# Patient Record
Sex: Female | Born: 1985 | Race: White | Hispanic: No | Smoking: Never smoker
Health system: Southern US, Community
[De-identification: ages and names within clinical notes are randomized; demographics above are authoritative.]

## PROBLEM LIST (undated history)

## (undated) DIAGNOSIS — R519 Headache, unspecified: Secondary | ICD-10-CM

## (undated) DIAGNOSIS — F32A Depression, unspecified: Secondary | ICD-10-CM

## (undated) DIAGNOSIS — F419 Anxiety disorder, unspecified: Secondary | ICD-10-CM

## (undated) DIAGNOSIS — I10 Essential (primary) hypertension: Secondary | ICD-10-CM

## (undated) DIAGNOSIS — H9190 Unspecified hearing loss, unspecified ear: Secondary | ICD-10-CM

## (undated) DIAGNOSIS — F329 Major depressive disorder, single episode, unspecified: Secondary | ICD-10-CM

## (undated) DIAGNOSIS — R51 Headache: Secondary | ICD-10-CM

## (undated) HISTORY — DX: Essential (primary) hypertension: I10

## (undated) HISTORY — PX: TONSILLECTOMY AND ADENOIDECTOMY: SUR1326

## (undated) HISTORY — DX: Major depressive disorder, single episode, unspecified: F32.9

## (undated) HISTORY — DX: Depression, unspecified: F32.A

## (undated) HISTORY — DX: Headache: R51

## (undated) HISTORY — DX: Headache, unspecified: R51.9

---

## 2011-12-16 ENCOUNTER — Encounter (HOSPITAL_COMMUNITY): Payer: Self-pay | Admitting: Emergency Medicine

## 2011-12-16 ENCOUNTER — Emergency Department (HOSPITAL_COMMUNITY)
Admission: EM | Admit: 2011-12-16 | Discharge: 2011-12-16 | Disposition: A | Discharge status: Other Acute Inpatient Hospital | Payer: BC Managed Care – PPO | Attending: Emergency Medicine | Admitting: Emergency Medicine

## 2011-12-16 ENCOUNTER — Inpatient Hospital Stay (HOSPITAL_COMMUNITY)
Admission: AD | Admit: 2011-12-16 | Discharge: 2011-12-17 | DRG: 750 | Disposition: A | Discharge status: Home / Self Care | Payer: BC Managed Care – PPO | Source: Ambulatory Visit | Attending: Psychiatry | Admitting: Psychiatry

## 2011-12-16 DIAGNOSIS — E669 Obesity, unspecified: Secondary | ICD-10-CM | POA: Insufficient documentation

## 2011-12-16 DIAGNOSIS — F1010 Alcohol abuse, uncomplicated: Secondary | ICD-10-CM

## 2011-12-16 DIAGNOSIS — F10929 Alcohol use, unspecified with intoxication, unspecified: Secondary | ICD-10-CM

## 2011-12-16 DIAGNOSIS — H9190 Unspecified hearing loss, unspecified ear: Secondary | ICD-10-CM

## 2011-12-16 DIAGNOSIS — R45851 Suicidal ideations: Secondary | ICD-10-CM

## 2011-12-16 DIAGNOSIS — F411 Generalized anxiety disorder: Secondary | ICD-10-CM

## 2011-12-16 DIAGNOSIS — F332 Major depressive disorder, recurrent severe without psychotic features: Secondary | ICD-10-CM

## 2011-12-16 LAB — COMPREHENSIVE METABOLIC PANEL
Albumin: 4.1 g/dL (ref 3.5–5.2)
Alkaline Phosphatase: 84 U/L (ref 39–117)
BUN: 11 mg/dL (ref 6–23)
Chloride: 104 mEq/L (ref 96–112)
Creatinine, Ser: 0.67 mg/dL (ref 0.50–1.10)
GFR calc Af Amer: >90 mL/min (ref >90)
Glucose, Bld: 105 mg/dL — ABNORMAL HIGH (ref 70–99)
Potassium: 3.5 mEq/L (ref 3.5–5.1)
Total Bilirubin: 0.2 mg/dL — ABNORMAL LOW (ref 0.3–1.2)
Total Protein: 7.5 g/dL (ref 6.0–8.3)

## 2011-12-16 LAB — CBC
HCT: 40 % (ref 36.0–46.0)
Hemoglobin: 14 g/dL (ref 12.0–15.0)
MCH: 31.7 pg (ref 26.0–34.0)
MCHC: 35 g/dL (ref 30.0–36.0)
MCV: 90.5 fL (ref 78.0–100.0)
Platelets: 238 K/uL (ref 150–400)
RBC: 4.42 MIL/uL (ref 3.87–5.11)
RDW: 12.6 % (ref 11.5–15.5)
WBC: 9.1 K/uL (ref 4.0–10.5)

## 2011-12-16 LAB — ETHANOL: Alcohol, Ethyl (B): 328 mg/dL — ABNORMAL HIGH (ref 0–11)

## 2011-12-16 LAB — RAPID URINE DRUG SCREEN, HOSP PERFORMED: Opiates: NOT DETECTED

## 2011-12-16 MED ORDER — ONDANSETRON 4 MG PO TBDP: 4.0000 mg | ORAL_TABLET | Freq: Four times a day (QID) | ORAL | Status: DC | PRN

## 2011-12-16 MED ORDER — CHLORDIAZEPOXIDE HCL 25 MG PO CAPS
50.0000 mg | ORAL_CAPSULE | Freq: Once | ORAL | Status: AC
  Administered 2011-12-16: 50 mg via ORAL
  Filled 2011-12-16: qty 2

## 2011-12-16 MED ORDER — LORAZEPAM 2 MG/ML IJ SOLN: 1.0000 mg | Freq: Four times a day (QID) | INTRAMUSCULAR | Status: DC | PRN

## 2011-12-16 MED ORDER — FOLIC ACID 1 MG PO TABS
1.0000 mg | ORAL_TABLET | Freq: Every day | ORAL | Status: DC
  Administered 2011-12-16: 1 mg via ORAL
  Filled 2011-12-16: qty 1

## 2011-12-16 MED ORDER — VITAMIN B-1 100 MG PO TABS: 100.0000 mg | ORAL_TABLET | Freq: Every day | ORAL | Status: DC

## 2011-12-16 MED ORDER — ONDANSETRON HCL 4 MG PO TABS: 4.0000 mg | ORAL_TABLET | Freq: Three times a day (TID) | ORAL | Status: DC | PRN

## 2011-12-16 MED ORDER — LORAZEPAM 1 MG PO TABS: 1.0000 mg | ORAL_TABLET | Freq: Four times a day (QID) | ORAL | Status: DC | PRN

## 2011-12-16 MED ORDER — HYDROXYZINE HCL 25 MG PO TABS: 25.0000 mg | ORAL_TABLET | Freq: Four times a day (QID) | ORAL | Status: DC | PRN

## 2011-12-16 MED ORDER — CHLORDIAZEPOXIDE HCL 25 MG PO CAPS: 25.0000 mg | ORAL_CAPSULE | Freq: Three times a day (TID) | ORAL | Status: DC

## 2011-12-16 MED ORDER — ACETAMINOPHEN 325 MG PO TABS
650.0000 mg | ORAL_TABLET | Freq: Four times a day (QID) | ORAL | Status: DC | PRN
  Administered 2011-12-16: 650 mg via ORAL
  Filled 2011-12-16: qty 2

## 2011-12-16 MED ORDER — THIAMINE HCL 100 MG/ML IJ SOLN
100.0000 mg | Freq: Every day | INTRAMUSCULAR | Status: DC
  Filled 2011-12-16: qty 2

## 2011-12-16 MED ORDER — LORAZEPAM 1 MG PO TABS: 0.0000 mg | ORAL_TABLET | Freq: Four times a day (QID) | ORAL | Status: DC

## 2011-12-16 MED ORDER — CHLORDIAZEPOXIDE HCL 25 MG PO CAPS: 25.0000 mg | ORAL_CAPSULE | ORAL | Status: DC

## 2011-12-16 MED ORDER — ADULT MULTIVITAMIN W/MINERALS CH
1.0000 | ORAL_TABLET | Freq: Every day | ORAL | Status: DC
  Administered 2011-12-16: 1 via ORAL
  Filled 2011-12-16: qty 1

## 2011-12-16 MED ORDER — MAGNESIUM HYDROXIDE 400 MG/5ML PO SUSP: 30.0000 mL | Freq: Every day | ORAL | Status: DC | PRN

## 2011-12-16 MED ORDER — CHLORDIAZEPOXIDE HCL 25 MG PO CAPS: 25.0000 mg | ORAL_CAPSULE | Freq: Four times a day (QID) | ORAL | Status: DC

## 2011-12-16 MED ORDER — ALUM & MAG HYDROXIDE-SIMETH 200-200-20 MG/5ML PO SUSP: 30.0000 mL | ORAL | Status: DC | PRN

## 2011-12-16 MED ORDER — NICOTINE 21 MG/24HR TD PT24
21.0000 mg | MEDICATED_PATCH | Freq: Every day | TRANSDERMAL | Status: DC
  Filled 2011-12-16: qty 1

## 2011-12-16 MED ORDER — CHLORDIAZEPOXIDE HCL 25 MG PO CAPS: 25.0000 mg | ORAL_CAPSULE | Freq: Every day | ORAL | Status: DC

## 2011-12-16 MED ORDER — CHLORDIAZEPOXIDE HCL 25 MG PO CAPS: 25.0000 mg | ORAL_CAPSULE | Freq: Four times a day (QID) | ORAL | Status: DC | PRN

## 2011-12-16 MED ORDER — ADULT MULTIVITAMIN W/MINERALS CH
1.0000 | ORAL_TABLET | Freq: Every day | ORAL | Status: DC
  Administered 2011-12-16: 2 via ORAL
  Filled 2011-12-16: qty 1

## 2011-12-16 MED ORDER — LORAZEPAM 1 MG PO TABS: 0.0000 mg | ORAL_TABLET | Freq: Two times a day (BID) | ORAL | Status: DC

## 2011-12-16 MED ORDER — LOPERAMIDE HCL 2 MG PO CAPS: 2.0000 mg | ORAL_CAPSULE | ORAL | Status: DC | PRN

## 2011-12-16 MED ORDER — THIAMINE HCL 100 MG/ML IJ SOLN: 100.0000 mg | Freq: Once | INTRAMUSCULAR | Status: DC

## 2011-12-16 MED ORDER — ZOLPIDEM TARTRATE 5 MG PO TABS: 5.0000 mg | ORAL_TABLET | Freq: Every evening | ORAL | Status: DC | PRN

## 2011-12-16 MED ORDER — TRAZODONE HCL 50 MG PO TABS
50.0000 mg | ORAL_TABLET | Freq: Every evening | ORAL | Status: DC | PRN
  Administered 2011-12-16: 50 mg via ORAL
  Filled 2011-12-16 (×5): qty 1

## 2011-12-16 MED ORDER — IBUPROFEN 200 MG PO TABS
400.0000 mg | ORAL_TABLET | Freq: Four times a day (QID) | ORAL | Status: DC | PRN
  Administered 2011-12-16: 400 mg via ORAL
  Filled 2011-12-16: qty 2

## 2011-12-16 MED ORDER — IBUPROFEN 600 MG PO TABS: 600.0000 mg | ORAL_TABLET | Freq: Three times a day (TID) | ORAL | Status: DC | PRN

## 2011-12-16 NOTE — ED Notes (Signed)
Report to Natasha RN

## 2011-12-16 NOTE — ED Provider Notes (Signed)
Pt resting comfortably. VS stable. Awaiting placement at [facility].  Loren Racer, MD 12/16/11 0730

## 2011-12-16 NOTE — ED Notes (Signed)
Pt alert, present to ED, +ETOH,  C/o SI. Denies HI, resp even unlabored, skin pwd
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
## 2011-12-16 NOTE — Progress Notes (Signed)
12/16/2011. 13:45. Voluntary admission, presents alone. Tearful. Last night she realized that she is drinking too much because alcohol is not solving her problems and she needs to talk with someone. Denies SI, but gets bad anxiety and stressed out. 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 Mother and friends are support group.

## 2011-12-16 NOTE — ED Notes (Signed)
Pt reports coming in for help with detox from ETOH, reports drinking everyday and, drank bottle of wine tonight after running into a female from her past. Pt feels like she is a disappointment to her mother who is a travel Engineer, civil (consulting). Pt reports having had 2 abortions in the past which mad her mom upset and that is the reason her mom left to travel out of state. Pt is student and reports she is going to graduate in May. Pt reports she thinks about killing herself everyday and has attempted in the past by cutting her wrist but she never told anybody about it. Pt reports struggling with depression and now wants some help before it kills her.

## 2011-12-16 NOTE — Progress Notes (Signed)
12/16/2011. 14:00. Pt signed a 72-hour request for release and verbalized understanding of same. 15:00. D: Friends of hers came and called from the lobby stating that they were here to pick her up, that she had called them.  They also indicted that she had told them that another friend of theirs is here as a patient currently.  Pt asked that we give them the code number so that they can visit. She also understands, per previous discussion, that she is not free to leave. She said that her friends already know that the other patient is here because one of them is his room-mate and he had actually brought that pt here.  A: Advised pt not to tell anyone if there is someone here that they know. R: Pt stated that she understands and will not do so.

## 2011-12-16 NOTE — ED Notes (Signed)
Security bedside to search pt and belongings

## 2011-12-16 NOTE — Discharge Planning (Signed)
Patient has been accepted by [facility] today. EDP has been notified. Patient is aware and agreeable to voluntary admission to [facility]. Will notify nursing of above.Sponsorship requested with [facility].   Bradly Bienenstock, SW Intern 12/16/2011 10:28 AM

## 2011-12-16 NOTE — ED Notes (Signed)
Requested pharmacy to change librium 25 mg to 1100.

## 2011-12-16 NOTE — ED Provider Notes (Signed)
History     CSN: [id number]  Arrival date & time 12/16/11  0043   First MD Initiated Contact with Patient 12/16/11 0057      Chief Complaint  Patient presents with  . Psychiatric Evaluation     HPI  History provided by the patient. Patient is a 26 year old female with no significant past medical history who presents with complaints of suicidal ideation and requests for help with alcohol detox. Patient states that she feels like she just wants to die and has thoughts of killing herself. Patient reports history of cutting her left wrist in the past over one year ago. She denies having any prior psychiatric diagnosis or evaluations. Patient denies any current medications. Patient also reports history of heavy alcohol use for the past year or longer. Patient reports drinking alcohol daily. Her last drink was several hours ago last evening. Patient denies ever having any serious withdrawal symptoms or complications. Patient denies having any other significant past medical history. Patient has no other complaints.    History reviewed. No pertinent past medical history.  Past Surgical History  Procedure Date  . Tonsillectomy and adenoidectomy     No family history on file.  History  Substance Use Topics  . Smoking status: Never Smoker   . Smokeless tobacco: Not on file  . Alcohol Use: Yes    OB History    Grav Para Term Preterm Abortions TAB SAB Ect Mult Living                  Review of Systems  Respiratory: Negative for shortness of breath.   Gastrointestinal: Negative for nausea, vomiting and abdominal pain.  Psychiatric/Behavioral: Positive for suicidal ideas.  All other systems reviewed and are negative.    Allergies  Review of patient's allergies indicates no known allergies.  Home Medications  No current outpatient prescriptions on file.  BP 155/89  Pulse 98  Temp 98 F (36.7 C)  Resp 16  Wt 240 lb (108.863 kg)  SpO2 99%  LMP 11/23/2011  Physical Exam    Nursing note and vitals reviewed. Constitutional: She is oriented to person, place, and time. She appears well-developed and well-nourished. No distress.  HENT:  Head: Normocephalic and atraumatic.  Cardiovascular: Normal rate and regular rhythm.   Pulmonary/Chest: Effort normal and breath sounds normal. No respiratory distress. She has no wheezes.  Abdominal:       Obese  Neurological: She is alert and oriented to person, place, and time.  Skin: Skin is warm and dry. No rash noted.  Psychiatric: Her behavior is normal. She exhibits a depressed mood. She expresses suicidal ideation.    ED Course  Procedures    Labs Reviewed  CBC  COMPREHENSIVE METABOLIC PANEL  ETHANOL  URINE RAPID DRUG SCREEN (HOSP PERFORMED)   Results for orders placed during the hospital encounter of 12/16/11  CBC      Component Value Range   WBC 9.1  4.0 - 10.5 (K/uL)   RBC 4.42  3.87 - 5.11 (MIL/uL)   Hemoglobin 14.0  12.0 - 15.0 (g/dL)   HCT 40.9  81.1 - 91.4 (%)   MCV 90.5  78.0 - 100.0 (fL)   MCH 31.7  26.0 - 34.0 (pg)   MCHC 35.0  30.0 - 36.0 (g/dL)   RDW 78.2  95.6 - 21.3 (%)   Platelets 238  150 - 400 (K/uL)  COMPREHENSIVE METABOLIC PANEL      Component Value Range   Sodium 140  135 -  145 (mEq/L)   Potassium 3.5  3.5 - 5.1 (mEq/L)   Chloride 104  96 - 112 (mEq/L)   CO2 26  19 - 32 (mEq/L)   Glucose, Bld 105 (*) 70 - 99 (mg/dL)   BUN 11  6 - 23 (mg/dL)   Creatinine, Ser 6.57  0.50 - 1.10 (mg/dL)   Calcium 9.1  8.4 - 84.6 (mg/dL)   Total Protein 7.5  6.0 - 8.3 (g/dL)   Albumin 4.1  3.5 - 5.2 (g/dL)   AST 17  0 - 37 (U/L)   ALT 18  0 - 35 (U/L)   Alkaline Phosphatase 84  39 - 117 (U/L)   Total Bilirubin 0.2 (*) 0.3 - 1.2 (mg/dL)   GFR calc non Af Amer >90  >90 (mL/min)   GFR calc Af Amer >90  >90 (mL/min)  ETHANOL      Component Value Range   Alcohol, Ethyl (B) 328 (*) 0 - 11 (mg/dL)  URINE RAPID DRUG SCREEN (HOSP PERFORMED)      Component Value Range   Opiates NONE DETECTED  NONE  DETECTED    Cocaine NONE DETECTED  NONE DETECTED    Benzodiazepines NONE DETECTED  NONE DETECTED    Amphetamines NONE DETECTED  NONE DETECTED    Tetrahydrocannabinol NONE DETECTED  NONE DETECTED    Barbiturates NONE DETECTED  NONE DETECTED        1. Alcohol abuse   2. Alcohol intoxication   3. Suicidal ideation       MDM  1:15AM patient seen and evaluated. Patient in no acute distress.   Patient moved to psych ed. I spoke with [facility] PAC team they will see patient and evaluate.  Psych holding orders and alcohol withdrawal protocols in place.     Angus Seller, [place] 12/16/11 [phone]

## 2011-12-16 NOTE — Discharge Planning (Signed)
This assessment/evaluation/note was completed by a Child psychotherapist of Social Work Warden/ranger and as Investment banker, corporate, I was immediately available for consultation/collaboration. I am in agreement with the contents and disposition reflected in the assessment/evaluation/note(s).  Ileene Hutchinson , MSW, LCSWA 12/16/2011 10:30 AM [phone]

## 2011-12-16 NOTE — ED Provider Notes (Signed)
Medical screening examination/treatment/procedure(s) were performed by non-physician practitioner and as supervising physician I was immediately available for consultation/collaboration.   Kellene Mccleary L Leondre Taul, MD 12/16/11 0559 

## 2011-12-16 NOTE — ED Notes (Signed)
Report given to Harriett Sine, RN, she stated "they were supposed to call you & tell you not to send the pt until 1230"; Diane, Charge, RN aware.

## 2011-12-17 DIAGNOSIS — F411 Generalized anxiety disorder: Secondary | ICD-10-CM

## 2011-12-17 LAB — PREGNANCY, URINE: Preg Test, Ur: NEGATIVE

## 2011-12-17 MED ORDER — ONDANSETRON 4 MG PO TBDP
4.0000 mg | ORAL_TABLET | Freq: Once | ORAL | Status: AC
  Administered 2011-12-17: 4 mg via ORAL

## 2011-12-17 MED ORDER — CHLORPROMAZINE HCL 25 MG PO TABS
25.0000 mg | ORAL_TABLET | Freq: Three times a day (TID) | ORAL | Status: DC
Start: 2011-12-17 — End: 2011-12-17

## 2011-12-17 MED ORDER — CHLORPROMAZINE HCL 25 MG PO TABS: 25.0000 mg | ORAL_TABLET | Freq: Three times a day (TID) | ORAL | Status: AC

## 2011-12-17 NOTE — Progress Notes (Signed)
Pt attended morning group. States that she decided to check into the hospital due to anxiety and depression. Feels that it contributes to her alcohol abuse. Pt states that she feels out of her element in the hospital and believes that if she could manage her anxiety and depression she would no longer abuse alcohol. Reports that she does not drink on a daily basis and amount of alcohol consumed depends on whether or not she has to go to class or work the next day. Pt would like to seek counseling outside of the hospital to help her manage her issues with stress and anxiety. 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

## 2011-12-17 NOTE — Progress Notes (Signed)
[facility] Adult Inpatient Family/Significant Other Suicide Prevention Education  Suicide Prevention Education:  Education Completed; Gae Bon, pt's Mother at [phone] has been identified by the patient as the family member/significant other with whom the patient will be residing, and identified as the person(s) who will aid the patient in the event of a mental health crisis (suicidal ideations/suicide attempt).  With written consent from the patient, the family member/significant other has been provided the following suicide prevention education, prior to the discharge of the patient.  The suicide prevention education provided includes the following:  Suicide risk factors  Suicide prevention and interventions  National Suicide Hotline telephone number  [facility] assessment telephone number  [facility] Emergency Assistance 911  [facility] and/or Residential Mobile Crisis Unit telephone number  Request made of family/significant other to:  Remove weapons (e.g., guns, rifles, knives), all items previously/currently identified as safety concern.    Remove drugs/medications (over-the-counter, prescriptions, illicit drugs), all items previously/currently identified as a safety concern.  Pt's mother states there are no firearms in the patient's home, nor narcotics and the alcohol has been disposed of.   The family member/significant other verbalizes understanding of the suicide prevention education information provided.  The family member/significant other agrees to remove the items of safety concern listed above.  Writer spoke with pt's mother in the lobby of [facility].  Clide Dales 12/17/2011, 4:38 PM
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
## 2011-12-17 NOTE — H&P (Signed)
Psychiatric Admission Assessment Adult  Patient Identification:  Julie Kennedy Date of Evaluation:  12/17/2011 Chief Complaint:  ETOH ABUSE  MDD REC SEV  ANXIETY DISORDER NOS History of Present Illness:: Pt. In through the ED due to increased alcohol intake and statements regarding not wanting to live. Mood Symptoms:  None Depression Symptoms: none  (Hypo) Manic Symptoms: none Anxiety Symptoms: increased alcohol intake Psychotic Symptoms: none PTSD Symptoms:  none Abuse/Neglect/Assault/trauma: denies   Primary Care Provider:  none  Past Medical History:  History reviewed. No pertinent past medical history. Traumatic Brain Injury: none History of Loss of Consciousness: none  Surgical History:  none Seizure History:   none Cardiac History:    none  Allergies:  No Known Allergies Current Medications:  Current Facility-Administered Medications  Medication Dose Route Frequency Provider Last Rate Last Dose  . acetaminophen (TYLENOL) tablet 650 mg  650 mg Oral Q6H PRN Verne Spurr, PA   650 mg at 12/16/11 1810  . alum & mag hydroxide-simeth (MAALOX/MYLANTA) 200-200-20 MG/5ML suspension 30 mL  30 mL Oral Q4H PRN Verne Spurr, PA      . chlorproMAZINE (THORAZINE) tablet 25 mg  25 mg Oral TID Verne Spurr, PA      . ibuprofen (ADVIL,MOTRIN) tablet 400 mg  400 mg Oral Q6H PRN Verne Spurr, PA   400 mg at 12/16/11 2247  . magnesium hydroxide (MILK OF MAGNESIA) suspension 30 mL  30 mL Oral Daily PRN Verne Spurr, PA      . ondansetron (ZOFRAN-ODT) disintegrating tablet 4 mg  4 mg Oral Once Verne Spurr, PA   4 mg at 12/17/11 0654  . traZODone (DESYREL) tablet 50 mg  50 mg Oral QHS,MR X 1 Verne Spurr, [place]   50 mg at 12/16/11 2247    Previous Psychotropic Medications: Medication Dose              2          Substance Abuse History Substance Age of 1st Use Last Use Amount Specific Type  Nicotine      Alcohol      Cannabis      Opiates      Cocaine      Methamphetamines       LSD      Ecstasy      Benzodiazepines      Caffeine      Inhalants      Others:                        Alcohol History: Age of 1st use: Previous rehab: Hx. Of DT: Hx. Of Seizures with withdrawal: Hx. Of black outs:  Legal Charges:  Time served: Court date: Dept. Social Services involvement:  DT's:  No Withdrawal Symptoms:  None  Social History: Current Place of Residence:   Place of Birth:   Family Members: Marital Status:   Children: Education:   Religious Beliefs/Practices: Employment: Hotel manager History:   Family History:  History reviewed. No pertinent family history.  ROS: PE:  LABS:   Mental Status Examination/Evaluation Objective:  Appearance: Disheveled  Eye Contact::  Good  Speech:  Normal Rate  Volume:  Normal  Mood:  anxious  Affect:  Appropriate  Thought Process:  Coherent  Orientation:  Full  Thought Content:  WDL  Suicidal Thoughts:  No  Homicidal Thoughts:  No  Judgement:  Fair  Insight:  Good  Psychomotor Activity:  Normal  Akathisia:  No  Handed:  Right  AIMS (if indicated):     Assets:  Desire for Improvement    AXIS I  Alcohol abuse  AXIS II Deferred  AXIS III History reviewed. No pertinent past medical history.   AXIS IV educational problems and occupational problems  AXIS V 51-60 moderate symptoms   Recommendations:  Treatment Plan Summary: Daily contact with patient to assess and evaluate symptoms and progress in treatment Medication management  Observation Level/Precautions:  Laboratory:    Psychotherapy:    Medications:    Routine PRN Medications:  No  Consultations:    Discharge Concerns:    Other:      Shamiracle Gorden 1/25/20134:09 PM

## 2011-12-17 NOTE — Progress Notes (Signed)
Lying quietly in bed with eyes closed.  No physical complaints or behavioral problems.  Q 15 min safety  checks are being conducted to maintain safety.
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
## 2011-12-21 NOTE — Discharge Summary (Signed)
Physician Discharge Summary Note  Patient:  Julie Kennedy is an 26 y.o., female MRN:  [id number] DOB:  [birth year] Patient phone:  [phone] (home)  Patient address:   [address] [place] [address],   Date of Admission:  12/16/2011 Date of Discharge: 1/26/013  Discharge Diagnoses: Hearing Impaired Alcohol abuse  Axis Diagnosis:   AXIS I:  Alcohol Abuse AXIS II:  Deferred AXIS III:  Significant Hearing Loss untreated AXIS IV:  educational problems, occupational problems, problems related to social environment and problems with primary support group AXIS V:  51-60 moderate symptoms  Level of Care:  OP  Hospital Course:  Unremarkable.  During the course of the patient's initial exam it was discovered that she is reading lips and does not hear well.  She could not pass the whisper test, and could not comprehend well what was being said very well  with her eyes closed.  After being seen by the MD it was felt that Julie Kennedy could be safely treated as an outpatient.  Her mother was contacted and agreed that Julie Kennedy was not a safety risk and she could stay with her for the next several days.  Julie Kennedy's mother is made aware that Julie Kennedy should be seen by an audiologist for a complete hearing work up As well as started with an out patient therapist.  Both Julie Kennedy and her mother were in agreement that this could happen and that Julie Kennedy could be safely discharged out.  Consults:  None  Significant Diagnostic Studies:  None  Discharge Vitals:   Blood pressure 138/84, pulse 91, temperature 97.6 F (36.4 C), temperature source Oral, resp. rate 16, height 5' 6.14" (1.68 m), weight 105.235 kg (232 lb), last menstrual period 11/23/2011.  Mental Status Exam: See Mental Status Examination and Suicide Risk Assessment completed by Attending Physician prior to discharge.  Discharge destination:  Home  Is patient on multiple antipsychotic therapies at discharge:  No   Has  Patient had three or more failed trials of antipsychotic monotherapy by history:  No  Recommended Plan for Multiple Antipsychotic Therapies:   Discharge Orders    Future Orders Please Complete By Expires   Diet - low sodium heart healthy      Increase activity slowly      Discharge instructions      Comments:   You have a hearing problem and will need to see an audiologist.  Please check with your insurance plan to see who is covered in your area. You will also need to see an outpatient counseling facility and a psychiatrist to continue your medication. Avoid self medicating with alcohol. 90 meetings in 90 days.     Medication List  As of 12/21/2011 12:14 AM   START taking these medications         chlorproMAZINE 25 MG tablet   Commonly known as: THORAZINE   Take 1 tablet (25 mg total) by mouth 3 (three) times daily.         CONTINUE taking these medications         ibuprofen 600 MG tablet   Commonly known as: ADVIL,MOTRIN      multivitamin per tablet          Where to get your medications    These are the prescriptions that you need to pick up.   You may get these medications from any pharmacy.         chlorproMAZINE 25 MG tablet  Follow-up Information    Follow up with [facility] of the [place] on 12/20/2011. (Walk in at 8:30 or 1:00 for assessment.  Let them know you are getting out of the hospital.  They have individual therapists, and they have substance abuse groups as well)    Contact information:   [address]  [place]  [phone]       Comments:  Please schedule to be seen by an Audiologist as soon as possible.  Your hearing is poor and should be evaluated. This is likely a part of your increased anxiety and poor self esteem.  Early treatment could help prevent any further loss of hearing as well as help you to improve your hearing.  Signed: Marylynne Keelin 12/21/2011, 12:14 AM  For Dr. Orson Aloe

## 2011-12-21 NOTE — Progress Notes (Signed)
Patient Discharge Instructions:  After Visit Summary Faxed,  12/20/2011 Faxed to the [facility] Care provider:  12/20/2011 Facesheet faxed 12/20/2011   Faxed to [facility] of the [facility] @ [phone]  Wandra Scot, 12/21/2011, 9:30 AM

## 2016-07-19 ENCOUNTER — Encounter (HOSPITAL_BASED_OUTPATIENT_CLINIC_OR_DEPARTMENT_OTHER): Payer: Self-pay | Admitting: Emergency Medicine

## 2016-07-19 ENCOUNTER — Emergency Department (HOSPITAL_BASED_OUTPATIENT_CLINIC_OR_DEPARTMENT_OTHER)
Admission: EM | Admit: 2016-07-19 | Discharge: 2016-07-19 | Disposition: A | Discharge status: Home / Self Care | Payer: BLUE CROSS/BLUE SHIELD | Attending: Emergency Medicine | Admitting: Emergency Medicine

## 2016-07-19 DIAGNOSIS — R21 Rash and other nonspecific skin eruption: Secondary | ICD-10-CM | POA: Diagnosis present

## 2016-07-19 DIAGNOSIS — L509 Urticaria, unspecified: Secondary | ICD-10-CM

## 2016-07-19 LAB — PREGNANCY, URINE: PREG TEST UR: NEGATIVE

## 2016-07-19 MED ORDER — DIPHENHYDRAMINE HCL 25 MG PO TABS: 25.0000 mg | ORAL_TABLET | Freq: Four times a day (QID) | ORAL | 0 refills | Status: DC | PRN

## 2016-07-19 MED FILL — BANOPHEN 25 MG CAPSULE: 25 | 25 days supply | Qty: 100 | Fill #0

## 2016-07-19 NOTE — ED Provider Notes (Signed)
[facility] Provider Note   CSN: [id number] Arrival date & time: 07/19/16  4098     History   Chief Complaint Chief Complaint  Patient presents with  . Rash    HPI Julie Kennedy is a 30 y.o. female.  The history is provided by the patient.  Rash   This is a new problem. The current episode started yesterday. The problem has been gradually worsening. The problem is associated with nothing. There has been no fever. The rash is present on the scalp, head, torso, back, abdomen, face, left arm, left upper leg, left lower leg, right arm, right hand, left hand, right upper leg and right lower leg. The pain is moderate. The pain has been constant since onset. Associated symptoms include itching and pain. Pertinent negatives include no blisters and no weeping. She has tried nothing for the symptoms. The treatment provided no relief.   LMP 3 weeks ago.  History reviewed. No pertinent past medical history.  There are no active problems to display for this patient.   Past Surgical History:  Procedure Laterality Date  . CESAREAN SECTION    . TONSILLECTOMY AND ADENOIDECTOMY      OB History    Gravida Para Term Preterm AB Living   1 1 1     1    SAB TAB Ectopic Multiple Live Births                   Home Medications    Prior to Admission medications   Medication Sig Start Date End Date Taking? Authorizing Provider  ibuprofen (ADVIL,MOTRIN) 600 MG tablet Take 600 mg by mouth every 6 (six) hours as needed.    Historical Provider, MD  multivitamin [facility]) per tablet Take 1 tablet by mouth daily.    Historical Provider, MD    Family History No family history on file.  Social History Social History  Substance Use Topics  . Smoking status: Never Smoker  . Smokeless tobacco: Not on file  . Alcohol use 1.8 oz/week    3 Glasses of wine per week     Allergies   Review of patient's allergies indicates no known allergies.   Review of Systems Review of  Systems  Constitutional: Negative for appetite change, chills, fatigue and fever.  HENT: Negative for congestion, facial swelling, mouth sores, rhinorrhea and sore throat.   Eyes: Negative for visual disturbance.  Respiratory: Negative for cough, chest tightness and shortness of breath.   Cardiovascular: Negative for chest pain and palpitations.  Gastrointestinal: Negative for abdominal pain, blood in stool, diarrhea, nausea and vomiting.  Endocrine: Negative for cold intolerance and heat intolerance.  Genitourinary: Negative for decreased urine volume, difficulty urinating and frequency.  Musculoskeletal: Negative for back pain and neck stiffness.  Skin: Positive for itching and rash.  Neurological: Negative for dizziness, weakness, light-headedness and headaches.  All other systems reviewed and are negative.    Physical Exam Updated Vital Signs BP (!) 142/104 (BP Location: Right Arm)   Pulse 99   Temp 97.8 F (36.6 C) (Oral)   Resp 18   Ht 5\' 7"  (1.702 m)   Wt 221 lb (100.2 kg)   SpO2 100%   BMI 34.61 kg/m   Physical Exam  Constitutional: She is oriented to person, place, and time. She appears well-developed and well-nourished. No distress.  HENT:  Head: Normocephalic and atraumatic.  Right Ear: External ear normal.  Left Ear: External ear normal.  Nose: Nose normal.  Eyes: Conjunctivae  and EOM are normal. Pupils are equal, round, and reactive to light. Right eye exhibits no discharge. Left eye exhibits no discharge. No scleral icterus.  Neck: Normal range of motion. Neck supple.  Cardiovascular: Normal rate, regular rhythm and normal heart sounds.  Exam reveals no gallop and no friction rub.   No murmur heard. Pulmonary/Chest: Effort normal and breath sounds normal. No stridor. No respiratory distress. She has no wheezes.  Abdominal: Soft. She exhibits no distension. There is no tenderness.  Musculoskeletal: She exhibits no edema or tenderness.  Neurological: She is  alert and oriented to person, place, and time.  Skin: Skin is warm and dry. Rash noted. Rash is urticarial (on scalp, face, torso, bilateral upper and lower extremities). She is not diaphoretic. No erythema.  Psychiatric: She has a normal mood and affect.     ED Treatments / Results  Labs (all labs ordered are listed, but only abnormal results are displayed) Labs Reviewed  PREGNANCY, URINE    EKG  EKG Interpretation None       Radiology No results found.  Procedures Procedures (including critical care time)  Medications Ordered in ED Medications - No data to display   Initial Impression / Assessment and Plan / ED Course  I have reviewed the triage vital signs and the nursing notes.  Pertinent labs & imaging results that were available during my care of the patient were reviewed by me and considered in my medical decision making (see chart for details).  Clinical Course    Urticaria. No known triggers. UPT neg here. Not suggestive of bedbugs, scabies. No airway compromise, no associated headache or vomiting. No GI symptoms. No evidence of anaphylaxis. Patient offered Benadryl and declined here. Reports she will take it at home. Given investigative instructions to sort out possible trigger. Given strict return precautions.  Safe for discharge.  Final Clinical Impressions(s) / ED Diagnoses   Final diagnoses:  Urticaria   Disposition: Discharge  Condition: Good  I have discussed the results, Dx and Tx plan with the patient who expressed understanding and agree(s) with the plan. Discharge instructions discussed at great length. The patient was given strict return precautions who verbalized understanding of the instructions. No further questions at time of discharge.    Current Discharge Medication List      [facility] [address] [place] [id number] [phone] Call  For help establishing  care with a care provider      Nira ConnPedro Eduardo Cardama, MD 07/19/16 [phone]

## 2016-07-19 NOTE — ED Triage Notes (Addendum)
Pt sts rash started yesterday afternoon.  Pt c/o itching and pain all over.  Hives all over pt's body, started at legs, and rose up body overnight.  No reports of doing anything out of the ordinary or being exposed to anything unusual. No new medications.  8/10 pain/discomfort.

## 2017-01-31 ENCOUNTER — Ambulatory Visit: Payer: Self-pay | Admitting: Adult Health

## 2017-02-15 ENCOUNTER — Ambulatory Visit: Payer: Self-pay | Admitting: Adult Health

## 2018-01-22 ENCOUNTER — Other Ambulatory Visit: Payer: Self-pay

## 2018-01-22 ENCOUNTER — Emergency Department (HOSPITAL_BASED_OUTPATIENT_CLINIC_OR_DEPARTMENT_OTHER)
Admission: EM | Admit: 2018-01-22 | Discharge: 2018-01-22 | Disposition: A | Discharge status: Home / Self Care | Payer: 59 | Attending: Emergency Medicine | Admitting: Emergency Medicine

## 2018-01-22 ENCOUNTER — Encounter (HOSPITAL_BASED_OUTPATIENT_CLINIC_OR_DEPARTMENT_OTHER): Payer: Self-pay | Admitting: *Deleted

## 2018-01-22 ENCOUNTER — Emergency Department (HOSPITAL_BASED_OUTPATIENT_CLINIC_OR_DEPARTMENT_OTHER): Payer: 59

## 2018-01-22 DIAGNOSIS — I10 Essential (primary) hypertension: Secondary | ICD-10-CM

## 2018-01-22 DIAGNOSIS — R079 Chest pain, unspecified: Secondary | ICD-10-CM | POA: Insufficient documentation

## 2018-01-22 DIAGNOSIS — E876 Hypokalemia: Secondary | ICD-10-CM | POA: Insufficient documentation

## 2018-01-22 HISTORY — DX: Anxiety disorder, unspecified: F41.9

## 2018-01-22 HISTORY — DX: Unspecified hearing loss, unspecified ear: H91.90

## 2018-01-22 LAB — BASIC METABOLIC PANEL
Anion gap: 15 (ref 5–15)
BUN: 5 mg/dL — ABNORMAL LOW (ref 6–20)
CALCIUM: 8.8 mg/dL — AB (ref 8.9–10.3)
CHLORIDE: 99 mmol/L — AB (ref 101–111)
CO2: 22 mmol/L (ref 22–32)
CREATININE: 0.73 mg/dL (ref 0.44–1.00)
GFR calc non Af Amer: >60 mL/min (ref >60)
Glucose, Bld: 128 mg/dL — ABNORMAL HIGH (ref 65–99)
Potassium: 2.7 mmol/L — CL (ref 3.5–5.1)
SODIUM: 136 mmol/L (ref 135–145)

## 2018-01-22 LAB — CBC
HCT: 38.6 % (ref 36.0–46.0)
HEMOGLOBIN: 13.8 g/dL (ref 12.0–15.0)
MCH: 35.6 pg — ABNORMAL HIGH (ref 26.0–34.0)
MCHC: 35.8 g/dL (ref 30.0–36.0)
MCV: 99.5 fL (ref 78.0–100.0)
Platelets: 187 10*3/uL (ref 150–400)
RBC: 3.88 MIL/uL (ref 3.87–5.11)
RDW: 15.4 % (ref 11.5–15.5)
WBC: 8.6 10*3/uL (ref 4.0–10.5)

## 2018-01-22 LAB — TROPONIN I

## 2018-01-22 LAB — D-DIMER, QUANTITATIVE (NOT AT ARMC): D DIMER QUANT: 0.49 ug{FEU}/mL (ref 0.00–0.50)

## 2018-01-22 MED ORDER — POTASSIUM CHLORIDE CRYS ER 20 MEQ PO TBCR
40.0000 meq | EXTENDED_RELEASE_TABLET | Freq: Once | ORAL | Status: AC
  Administered 2018-01-22: 40 meq via ORAL
  Filled 2018-01-22: qty 2

## 2018-01-22 MED ORDER — GI COCKTAIL ~~LOC~~
30.0000 mL | Freq: Once | ORAL | Status: AC
  Administered 2018-01-22: 30 mL via ORAL
  Filled 2018-01-22: qty 30

## 2018-01-22 NOTE — ED Notes (Signed)
ED Provider at bedside to discuss results. 

## 2018-01-22 NOTE — ED Notes (Signed)
Pt given d/c instructions as per chart. Verbalizes understanding. No questions. 

## 2018-01-22 NOTE — ED Triage Notes (Addendum)
C/o anterior chest tightness that started yesterday. States she has a history of reflux. C/o feeling sob at times.  States she has been congested and coughing. Denies fevers. C/o a little bit of nausea. No distress. resp even and unlabored. Child has the flu. Denies any leg swelling.Does take birth control

## 2018-01-22 NOTE — ED Notes (Signed)
Patient transported to X-ray 

## 2018-01-22 NOTE — ED Provider Notes (Signed)
[facility] EMERGENCY DEPARTMENT Provider Note   CSN: [id number] Arrival date & time: 01/22/18  0353     History   Chief Complaint Chief Complaint  Patient presents with  . chest tightness, reflux symptoms    HPI Julie Kennedy is a 32 y.o. female.  HPI Pt started having chest pain this afternoon after work.  The pain is a tightness in the center of her chest.  Nothing seems to make it worse.  She tried antacids without relief.   Some slight cough but no uri sx.  No history of heart disease or PE.  She does take OCPs Past Medical History:  Diagnosis Date  . Anxiety   . Hard of hearing     There are no active problems to display for this patient.   Past Surgical History:  Procedure Laterality Date  . CESAREAN SECTION    . TONSILLECTOMY AND ADENOIDECTOMY      OB History    Gravida Para Term Preterm AB Living   1 1 1     1    SAB TAB Ectopic Multiple Live Births                   Home Medications    Prior to Admission medications   Medication Sig Start Date End Date Taking? Authorizing Provider  diphenhydrAMINE (BENADRYL) 25 MG tablet Take 1 tablet (25 mg total) by mouth every 6 (six) hours as needed. 07/19/16   Nira Conn, MD  ibuprofen (ADVIL,MOTRIN) 600 MG tablet Take 600 mg by mouth every 6 (six) hours as needed.    [provider]  multivitamin [facility]) per tablet Take 1 tablet by mouth daily.    [provider]    Family History No family history on file.  Social History Social History   Tobacco Use  . Smoking status: Never Smoker  . Smokeless tobacco: Never Used  Substance Use Topics  . Alcohol use: Yes    Alcohol/week: 1.8 oz    Types: 3 Glasses of wine per week  . Drug use: Yes    Types: Cocaine    Comment: pt denies a history, but cocaine listed from previous visit      Allergies   Patient has no known allergies.   Review of Systems Review of Systems  All other systems reviewed and are  negative.    Physical Exam Updated Vital Signs BP (!) 158/102   Pulse 96   Temp (!) 97.5 F (36.4 C)   Resp (!) 27   Ht 1.727 m (5\' 8" )   Wt 97.5 kg (215 lb)   LMP 01/22/2018 (Exact Date)   SpO2 100%   BMI 32.69 kg/m   Physical Exam  Constitutional: She appears well-developed and well-nourished. No distress.  HENT:  Head: Normocephalic and atraumatic.  Right Ear: External ear normal.  Left Ear: External ear normal.  Eyes: Conjunctivae are normal. Right eye exhibits no discharge. Left eye exhibits no discharge. No scleral icterus.  Neck: Neck supple. No tracheal deviation present.  Cardiovascular: Normal rate, regular rhythm and intact distal pulses.  Pulmonary/Chest: Effort normal and breath sounds normal. No stridor. No respiratory distress. She has no wheezes. She has no rales.  Abdominal: Soft. Bowel sounds are normal. She exhibits no distension. There is no tenderness. There is no rebound and no guarding.  Musculoskeletal: She exhibits no edema or tenderness.  Neurological: She is alert. She has normal strength. No cranial nerve deficit (no facial droop, extraocular  movements intact, no slurred speech) or sensory deficit. She exhibits normal muscle tone. She displays no seizure activity. Coordination normal.  Skin: Skin is warm and dry. No rash noted.  Psychiatric: She has a normal mood and affect.  Nursing note and vitals reviewed.    ED Treatments / Results  Labs (all labs ordered are listed, but only abnormal results are displayed) Labs Reviewed  CBC - Abnormal; Notable for the following components:      Result Value   MCH 35.6 (*)    All other components within normal limits  BASIC METABOLIC PANEL - Abnormal; Notable for the following components:   Potassium 2.7 (*)    Chloride 99 (*)    Glucose, Bld 128 (*)    BUN 5 (*)    Calcium 8.8 (*)    All other components within normal limits  D-DIMER, QUANTITATIVE (NOT AT [facility]  TROPONIN I    EKG  EKG  Interpretation  Date/Time:  Sunday January 22 2018 04:09:00 EST Ventricular Rate:  108 PR Interval:    QRS Duration: 79 QT Interval:  338 QTC Calculation: 453 R Axis:   64 Text Interpretation:  Sinus tachycardia No old tracing to compare Confirmed by Linwood Dibbles [phone]) on 01/22/2018 4:12:33 AM       Radiology Dg Chest 2 View  Result Date: 01/22/2018 CLINICAL DATA:  32 y/o F; anterior chest tightness starting yesterday. EXAM: CHEST  2 VIEW COMPARISON:  None. FINDINGS: The heart size and mediastinal contours are within normal limits. Both lungs are clear. The visualized skeletal structures are unremarkable. IMPRESSION: No acute pulmonary process identified. Electronically Signed   By: Mitzi Hansen M.D.   On: 01/22/2018 04:53    Procedures Procedures (including critical care time)  Medications Ordered in ED Medications  potassium chloride SA (K-DUR,KLOR-CON) CR tablet 40 mEq (40 mEq Oral Given 01/22/18 0510)  gi cocktail (Maalox,Lidocaine,Donnatal) (30 mLs Oral Given 01/22/18 1308)     Initial Impression / Assessment and Plan / ED Course  I have reviewed the triage vital signs and the nursing notes.  Pertinent labs & imaging results that were available during my care of the patient were reviewed by me and considered in my medical decision making (see chart for details).  Clinical Course as of Jan 23 516  Sun Jan 22, 2018  0515 Troponin and d-dimer are normal.  Electrolytes are notable for low potassium level.  [JK]    Clinical Course User Index [JK] Linwood Dibbles, MD  Patient presented to the emergency room for evaluation of chest pain.  She is overall low risk for acute coronary syndrome.  Her troponin is negative and EKG is reassuring.  Patient's d-dimer is negative.  I doubt PE.  Patient symptoms may be related to acid reflux.  I will have her continue her antacid medication and try adding on Maalox.  His blood pressure was related today.  I discussed outpatient follow-up  with her primary care doctor.  Patient was also hypokalemic.  She is on any medications that would contribute to hypokalemia.  I will give the patient oral dose of potassium here.  We discussed increasing potassium rich foods in her diet and having her blood pressure and potassium level rechecked by a primary care doctor in the next week or 2  Final Clinical Impressions(s) / ED Diagnoses   Final diagnoses:  Chest pain, unspecified type  Hypokalemia  Hypertension, unspecified type    ED Discharge Orders    None  Linwood DibblesKnapp, Marki Frede, MD 01/22/18 [phone]

## 2018-01-22 NOTE — ED Notes (Signed)
Pt states she works two jobs and daughter was recently dx'd with the flu. Her and her husband split up recently as well and "it has just been a lot trying to do everything by myself." C/O chest tightness with some SHOB. Denies other s/s. Hx reflux. Also takes BC pills. EKG done on arrival to room. Placed on monitor showing ST. BBS-clr. EDP at bedside to evaluate.

## 2018-01-22 NOTE — Discharge Instructions (Signed)
Follow-up with a primary care doctor to have your blood pressure rechecked and to also have your potassium level rechecked at some point, continue with the antacid medications, you can also take over-the-counter Tums or Maalox.  Please review the information about potassium in foods

## 2018-01-22 NOTE — ED Notes (Signed)
Dr Lynelle DoctorKnapp aware pt's potassium level is 2.7

## 2018-03-27 ENCOUNTER — Encounter (HOSPITAL_BASED_OUTPATIENT_CLINIC_OR_DEPARTMENT_OTHER): Payer: Self-pay | Admitting: *Deleted

## 2018-03-27 ENCOUNTER — Other Ambulatory Visit: Payer: Self-pay

## 2018-03-27 ENCOUNTER — Emergency Department (HOSPITAL_BASED_OUTPATIENT_CLINIC_OR_DEPARTMENT_OTHER)
Admission: EM | Admit: 2018-03-27 | Discharge: 2018-03-27 | Disposition: A | Discharge status: Home / Self Care | Payer: 59 | Attending: Emergency Medicine | Admitting: Emergency Medicine

## 2018-03-27 ENCOUNTER — Emergency Department (HOSPITAL_BASED_OUTPATIENT_CLINIC_OR_DEPARTMENT_OTHER): Payer: 59

## 2018-03-27 DIAGNOSIS — R0789 Other chest pain: Secondary | ICD-10-CM | POA: Diagnosis not present

## 2018-03-27 DIAGNOSIS — R030 Elevated blood-pressure reading, without diagnosis of hypertension: Secondary | ICD-10-CM

## 2018-03-27 DIAGNOSIS — M79662 Pain in left lower leg: Secondary | ICD-10-CM | POA: Diagnosis present

## 2018-03-27 LAB — PREGNANCY, URINE: Preg Test, Ur: NEGATIVE

## 2018-03-27 LAB — BASIC METABOLIC PANEL
ANION GAP: 14 (ref 5–15)
BUN: 11 mg/dL (ref 6–20)
CALCIUM: 8.5 mg/dL — AB (ref 8.9–10.3)
CO2: 19 mmol/L — ABNORMAL LOW (ref 22–32)
Chloride: 101 mmol/L (ref 101–111)
Creatinine, Ser: 0.87 mg/dL (ref 0.44–1.00)
GFR calc Af Amer: >60 mL/min (ref >60)
GLUCOSE: 99 mg/dL (ref 65–99)
Potassium: 3.1 mmol/L — ABNORMAL LOW (ref 3.5–5.1)
Sodium: 134 mmol/L — ABNORMAL LOW (ref 135–145)

## 2018-03-27 LAB — CBC WITH DIFFERENTIAL/PLATELET
BASOS ABS: 0 10*3/uL (ref 0.0–0.1)
Basophils Relative: 0 %
Eosinophils Absolute: 0 10*3/uL (ref 0.0–0.7)
Eosinophils Relative: 0 %
HEMATOCRIT: 38.8 % (ref 36.0–46.0)
Hemoglobin: 14.2 g/dL (ref 12.0–15.0)
LYMPHS ABS: 2.9 10*3/uL (ref 0.7–4.0)
LYMPHS PCT: 28 %
MCH: 37.9 pg — ABNORMAL HIGH (ref 26.0–34.0)
MCHC: 36.6 g/dL — ABNORMAL HIGH (ref 30.0–36.0)
MCV: 103.5 fL — ABNORMAL HIGH (ref 78.0–100.0)
Monocytes Absolute: 0.6 10*3/uL (ref 0.1–1.0)
Monocytes Relative: 6 %
NEUTROS ABS: 6.7 10*3/uL (ref 1.7–7.7)
Neutrophils Relative %: 66 %
PLATELETS: 217 10*3/uL (ref 150–400)
RBC: 3.75 MIL/uL — ABNORMAL LOW (ref 3.87–5.11)
RDW: 13.5 % (ref 11.5–15.5)
WBC: 10.2 10*3/uL (ref 4.0–10.5)

## 2018-03-27 LAB — D-DIMER, QUANTITATIVE: D-Dimer, Quant: 0.4 ug/mL-FEU (ref 0.00–0.50)

## 2018-03-27 LAB — TROPONIN I: Troponin I: <0.03 ng/mL (ref <0.03)

## 2018-03-27 MED ORDER — POTASSIUM CHLORIDE CRYS ER 20 MEQ PO TBCR
40.0000 meq | EXTENDED_RELEASE_TABLET | Freq: Once | ORAL | Status: AC
  Administered 2018-03-27: 40 meq via ORAL
  Filled 2018-03-27: qty 2

## 2018-03-27 NOTE — ED Triage Notes (Signed)
Pain in her left lower leg since yesterday. She had tightness in her chest while at work. She was seen at [facility] and had an EKG.

## 2018-03-27 NOTE — Discharge Instructions (Addendum)
If you develop worsening, new, or continued chest pain, or develop shortness of breath or if your leg becomes swollen or more painful or painful in different areas, return to the ER for evaluation.  Your blood pressure was elevated today, you will need this rechecked.  If you develop any other new/concerning symptoms, come back to the ER or see your primary care doctor.

## 2018-03-27 NOTE — ED Provider Notes (Signed)
[facility] EMERGENCY DEPARTMENT Provider Note   CSN: [id number] Arrival date & time: 03/27/18  1447     History   Chief Complaint Chief Complaint  Patient presents with  . Leg Pain    HPI Julie Kennedy is a 32 y.o. female.  HPI  32 year old female presents with chest tightness and left calf pain.  The calf pain started yesterday.  Does not remember specific injury.  She was recently at the beach so she initially attributed to a possible strain but does not remember an injury.  She took some Excedrin this morning with no relief.  The pain is not worse today but still present.  Nothing seems to make it better or worse.  There has been no swelling or redness or numbness.  Since waking up around 5 AM she had continuous chest tightness and tingling to her fingertips.  No shortness of breath, diaphoresis.  She does feel mildly anxious at this time.  She went to urgent care and was sent here. She is on OCP.  Past Medical History:  Diagnosis Date  . Anxiety   . Hard of hearing     There are no active problems to display for this patient.   Past Surgical History:  Procedure Laterality Date  . CESAREAN SECTION    . TONSILLECTOMY AND ADENOIDECTOMY       OB History    Gravida  1   Para  1   Term  1   Preterm      AB      Living  1     SAB      TAB      Ectopic      Multiple      Live Births               Home Medications    Prior to Admission medications   Medication Sig Start Date End Date Taking? Authorizing Provider  norgestimate-ethinyl estradiol (ORTHO-CYCLEN,SPRINTEC,PREVIFEM) 0.25-35 MG-MCG tablet Take 1 tablet by mouth daily.   Yes [provider]  diphenhydrAMINE (BENADRYL) 25 MG tablet Take 1 tablet (25 mg total) by mouth every 6 (six) hours as needed. 07/19/16   Nira Conn, MD  ibuprofen (ADVIL,MOTRIN) 600 MG tablet Take 600 mg by mouth every 6 (six) hours as needed.    [provider]  multivitamin  [facility]) per tablet Take 1 tablet by mouth daily.    [provider]    Family History No family history on file.  Social History Social History   Tobacco Use  . Smoking status: Never Smoker  . Smokeless tobacco: Never Used  Substance Use Topics  . Alcohol use: Yes    Alcohol/week: 1.8 oz    Types: 3 Glasses of wine per week  . Drug use: Yes    Types: Cocaine    Comment: pt denies a history, but cocaine listed from previous visit      Allergies   Patient has no known allergies.   Review of Systems Review of Systems  Constitutional: Negative for diaphoresis.  Respiratory: Positive for chest tightness. Negative for shortness of breath.   Cardiovascular: Negative for leg swelling.  Gastrointestinal: Negative for abdominal pain, nausea and vomiting.  Musculoskeletal: Positive for myalgias.  Neurological: Negative for weakness and numbness.  All other systems reviewed and are negative.    Physical Exam Updated Vital Signs BP (!) 131/108   Pulse (!) 103   Temp 98.5 F (36.9 C) (Oral)  Resp 18   Ht  (1.702 m)   Wt 99.8 kg (220 lb)   LMP 03/21/2018   SpO2 100%   BMI 34.46 kg/m   Physical Exam  Constitutional: She is oriented to person, place, and time. She appears well-developed and well-nourished. No distress.  HENT:  Head: Normocephalic and atraumatic.  Right Ear: External ear normal.  Left Ear: External ear normal.  Nose: Nose normal.  Eyes: Right eye exhibits no discharge. Left eye exhibits no discharge.  Cardiovascular: Regular rhythm, normal heart sounds and intact distal pulses. Tachycardia present.  Pulmonary/Chest: Effort normal and breath sounds normal.  Abdominal: Soft. There is no tenderness.  Musculoskeletal:       Left lower leg: She exhibits tenderness (mild, proximal calf, hard to reproduce). She exhibits no swelling and no edema.  Neurological: She is alert and oriented to person, place, and time.  Skin: Skin is warm and  dry. She is not diaphoretic.  Psychiatric: Her mood appears anxious.  Nursing note and vitals reviewed.    ED Treatments / Results  Labs (all labs ordered are listed, but only abnormal results are displayed) Labs Reviewed  BASIC METABOLIC PANEL - Abnormal; Notable for the following components:      Result Value   Sodium 134 (*)    Potassium 3.1 (*)    CO2 19 (*)    Calcium 8.5 (*)    All other components within normal limits  CBC WITH DIFFERENTIAL/PLATELET - Abnormal; Notable for the following components:   RBC 3.75 (*)    MCV 103.5 (*)    MCH 37.9 (*)    MCHC 36.6 (*)    All other components within normal limits  D-DIMER, QUANTITATIVE (NOT AT [facility])  PREGNANCY, URINE  TROPONIN I    EKG EKG Interpretation  Date/Time:  Monday Mar 27 2018 18:18:00 EDT Ventricular Rate:  98 PR Interval:    QRS Duration: 84 QT Interval:  362 QTC Calculation: 463 R Axis:   97 Text Interpretation:  Sinus rhythm Borderline right axis deviation no acute ST/T changes no significant change since Mar 2019 Confirmed by Pricilla Loveless [phone]) on 03/27/2018 6:35:17 PM   Radiology Dg Chest 2 View  Result Date: 03/27/2018 CLINICAL DATA:  32 year old female with left lower leg pain and chest tightness EXAM: CHEST - 2 VIEW COMPARISON:  Prior chest x-ray 01/22/2018 FINDINGS: The lungs are clear and negative for focal airspace consolidation, pulmonary edema or suspicious pulmonary nodule. No pleural effusion or pneumothorax. Cardiac and mediastinal contours are within normal limits. No acute fracture or lytic or blastic osseous lesions. The visualized upper abdominal bowel gas pattern is unremarkable. IMPRESSION: Negative chest x-ray. Electronically Signed   By: Malachy Moan M.D.   On: 03/27/2018 18:16    Procedures Procedures (including critical care time)  Medications Ordered in ED Medications  potassium chloride SA (K-DUR,KLOR-CON) CR tablet 40 mEq (40 mEq Oral Given 03/27/18 1832)     Initial  Impression / Assessment and Plan / ED Course  I have reviewed the triage vital signs and the nursing notes.  Pertinent labs & imaging results that were available during my care of the patient were reviewed by me and considered in my medical decision making (see chart for details).     Patient's work-up was unremarkable besides mild hypokalemia.  She was given some potassium repletion in the ED.  Her chest tightness is quite atypical and with no acute, concerning ST/T wave changes and a low risk chest pain work-up,  my suspicion for ACS is low.  Also doubt this is PE although she does have a risk factor being on birth control.  D-dimer is negative.  The calf pain is vague and hard to reproduce.  She does not appear in distress and rates the pain only as a 3/10.  Negative Homans sign.  Thus I think this is also unlikely to be DVT with no swelling.  Given the negative d-dimer we discussed that this is unlikely to be DVT although not impossible.  Will defer ultrasound imaging at this time but if her symptoms were to worsen or not go away she might need ultrasound.  We discussed this as well as other return precautions.  Her blood pressure was noted to be high here and though it has trended down, she will need to have this rechecked with her PCP.  Final Clinical Impressions(s) / ED Diagnoses   Final diagnoses:  Pain of left calf  Chest tightness  Elevated blood pressure reading    ED Discharge Orders    None       Pricilla Loveless, MD 03/27/18 [phone]

## 2018-03-27 NOTE — ED Notes (Signed)
ED Provider at bedside. 

## 2018-04-18 ENCOUNTER — Emergency Department (HOSPITAL_BASED_OUTPATIENT_CLINIC_OR_DEPARTMENT_OTHER)
Admission: EM | Admit: 2018-04-18 | Discharge: 2018-04-18 | Disposition: A | Discharge status: Home / Self Care | Payer: 59 | Attending: Emergency Medicine | Admitting: Emergency Medicine

## 2018-04-18 ENCOUNTER — Encounter (HOSPITAL_BASED_OUTPATIENT_CLINIC_OR_DEPARTMENT_OTHER): Payer: Self-pay

## 2018-04-18 ENCOUNTER — Other Ambulatory Visit: Payer: Self-pay

## 2018-04-18 ENCOUNTER — Emergency Department (HOSPITAL_BASED_OUTPATIENT_CLINIC_OR_DEPARTMENT_OTHER): Payer: 59

## 2018-04-18 DIAGNOSIS — E876 Hypokalemia: Secondary | ICD-10-CM | POA: Diagnosis not present

## 2018-04-18 DIAGNOSIS — Z79899 Other long term (current) drug therapy: Secondary | ICD-10-CM | POA: Diagnosis not present

## 2018-04-18 DIAGNOSIS — I10 Essential (primary) hypertension: Secondary | ICD-10-CM

## 2018-04-18 DIAGNOSIS — R079 Chest pain, unspecified: Secondary | ICD-10-CM

## 2018-04-18 DIAGNOSIS — R0789 Other chest pain: Secondary | ICD-10-CM | POA: Diagnosis present

## 2018-04-18 LAB — CBC
HCT: 39.1 % (ref 36.0–46.0)
Hemoglobin: 14.3 g/dL (ref 12.0–15.0)
MCH: 37.6 pg — AB (ref 26.0–34.0)
MCHC: 36.6 g/dL — AB (ref 30.0–36.0)
MCV: 102.9 fL — AB (ref 78.0–100.0)
PLATELETS: 234 10*3/uL (ref 150–400)
RBC: 3.8 MIL/uL — AB (ref 3.87–5.11)
RDW: 13.1 % (ref 11.5–15.5)
WBC: 7.3 10*3/uL (ref 4.0–10.5)

## 2018-04-18 LAB — BASIC METABOLIC PANEL
Anion gap: 12 (ref 5–15)
BUN: 6 mg/dL (ref 6–20)
CALCIUM: 8.9 mg/dL (ref 8.9–10.3)
CHLORIDE: 101 mmol/L (ref 101–111)
CO2: 20 mmol/L — ABNORMAL LOW (ref 22–32)
CREATININE: 0.76 mg/dL (ref 0.44–1.00)
GFR calc Af Amer: >60 mL/min (ref >60)
GFR calc non Af Amer: >60 mL/min (ref >60)
GLUCOSE: 115 mg/dL — AB (ref 65–99)
Potassium: 3.2 mmol/L — ABNORMAL LOW (ref 3.5–5.1)
Sodium: 133 mmol/L — ABNORMAL LOW (ref 135–145)

## 2018-04-18 LAB — TROPONIN I

## 2018-04-18 NOTE — ED Triage Notes (Addendum)
C/o CP x 1-2 months-states she was seen here for same recently-was advised to f/u with PCP for elevated BP-NAD-tearful

## 2018-04-18 NOTE — ED Notes (Signed)
Pt verbalizes understanding of d/c instructions and denies any further needs at this time. 

## 2018-04-18 NOTE — ED Notes (Signed)
Pt arrives to room 8 crying.  Pt is very anxious and emotional and appears frightened.  Pt states that she has been under lots of stress.  She states that she works 2 jobs and is a single parent.  Pt becomes more calm once I reassure her that her work up so far has been reassuring.  Pt denies any swelling in extremities, no recent long distance travel, non smoker, not on birthcontrol, no chance of being pregnant per pt. No sob.  Pt reports deep breathing helps a little bit.

## 2018-04-18 NOTE — Discharge Instructions (Signed)
Follow-up with your doctor.  See if they can get you in sooner.  Your blood pressure was a little elevated here again and will need to be followed.  You also have a mild hypokalemia.

## 2018-04-18 NOTE — ED Provider Notes (Signed)
[facility] EMERGENCY DEPARTMENT Provider Note   CSN: [id number] Arrival date & time: 04/18/18  1303     History   Chief Complaint Chief Complaint  Patient presents with  . Chest Pain    HPI Julie Kennedy is a 32 y.o. female.  HPI Patient presents with chest pain.  It is dull in her mid chest started on left side and then went to right.  Began today.  Seen a couple weeks ago for the same.  Has been seen by her urgent care for her primary care also.  Has not had pain with exertion.  Pain is dull.  Was initially tearful.  No fevers.  No cough.  Blood pressure is mildly elevated also.  No family history of early cardiac disease.  No swelling in her legs though had previously had calf pain.  Patient appears rather anxious about this.  States she has follow-up with primary care doctor but is for a month from now and does not know if she can wait.  Pain is not worse with moving. Past Medical History:  Diagnosis Date  . Anxiety   . Hard of hearing     There are no active problems to display for this patient.   Past Surgical History:  Procedure Laterality Date  . CESAREAN SECTION    . TONSILLECTOMY AND ADENOIDECTOMY       OB History    Gravida  1   Para  1   Term  1   Preterm      AB      Living  1     SAB      TAB      Ectopic      Multiple      Live Births               Home Medications    Prior to Admission medications   Medication Sig Start Date End Date Taking? Authorizing Provider  diphenhydrAMINE (BENADRYL) 25 MG tablet Take 1 tablet (25 mg total) by mouth every 6 (six) hours as needed. 07/19/16   Nira Conn, MD  ibuprofen (ADVIL,MOTRIN) 600 MG tablet Take 600 mg by mouth every 6 (six) hours as needed.    [provider]  multivitamin [facility]) per tablet Take 1 tablet by mouth daily.    [provider]  norgestimate-ethinyl estradiol (ORTHO-CYCLEN,SPRINTEC,PREVIFEM) 0.25-35 MG-MCG tablet Take 1 tablet  by mouth daily.    [provider]    Family History No family history on file.  Social History Social History   Tobacco Use  . Smoking status: Never Smoker  . Smokeless tobacco: Never Used  Substance Use Topics  . Alcohol use: Yes    Comment: occ  . Drug use: Not Currently    Comment: pt denies a history, but cocaine listed from previous visit      Allergies   Patient has no known allergies.   Review of Systems Review of Systems  Constitutional: Negative for appetite change.  HENT: Negative for congestion.   Respiratory: Negative for shortness of breath.   Cardiovascular: Positive for chest pain.  Gastrointestinal: Negative for abdominal pain.  Genitourinary: Negative for flank pain.  Musculoskeletal: Negative for back pain.  Skin: Negative for pallor and rash.  Neurological: Negative for weakness.  Hematological: Negative for adenopathy.  Psychiatric/Behavioral: Negative for confusion.     Physical Exam Updated Vital Signs BP (!) 142/101 (BP Location: Right Arm)   Pulse 100   Temp  98.7 F (37.1 C)   Resp 15   LMP 03/31/2018   SpO2 100%   Physical Exam  Constitutional: She appears well-developed.  HENT:  Head: Normocephalic.  Neck: Neck supple.  Cardiovascular: Regular rhythm and normal pulses.  Musculoskeletal:       Right lower leg: Normal.       Left lower leg: Normal.  Neurological: She is alert.  Skin: Skin is warm. Capillary refill takes less than 2 seconds.     ED Treatments / Results  Labs (all labs ordered are listed, but only abnormal results are displayed) Labs Reviewed  BASIC METABOLIC PANEL - Abnormal; Notable for the following components:      Result Value   Sodium 133 (*)    Potassium 3.2 (*)    CO2 20 (*)    Glucose, Bld 115 (*)    All other components within normal limits  CBC - Abnormal; Notable for the following components:   RBC 3.80 (*)    MCV 102.9 (*)    MCH 37.6 (*)    MCHC 36.6 (*)    All other  components within normal limits  TROPONIN I    EKG EKG Interpretation  Date/Time:  Tuesday Apr 18 2018 13:15:14 EDT Ventricular Rate:  122 PR Interval:  128 QRS Duration: 76 QT Interval:  314 QTC Calculation: 447 R Axis:   121 Text Interpretation:  Sinus tachycardia Left posterior fascicular block Anterior infarct , age undetermined Abnormal ECG since last tracing no significant change Confirmed by Rolan Bucco [phone]) on 04/18/2018 1:24:21 PM   Radiology Dg Chest 2 View  Result Date: 04/18/2018 CLINICAL DATA:  Left chest pain beginning today. EXAM: CHEST - 2 VIEW COMPARISON:  PA and lateral chest 03/27/2018 and 01/22/2018. FINDINGS: The lungs are clear. Heart size is normal. No pneumothorax or pleural fluid. No bony abnormality. IMPRESSION: Negative chest. Electronically Signed   By: Drusilla Kanner M.D.   On: 04/18/2018 13:40    Procedures Procedures (including critical care time)  Medications Ordered in ED Medications - No data to display   Initial Impression / Assessment and Plan / ED Course  I have reviewed the triage vital signs and the nursing notes.  Pertinent labs & imaging results that were available during my care of the patient were reviewed by me and considered in my medical decision making (see chart for details).     Patient with chest pain.  Low risk.  EKG stable.  Enzymes negative.  X-ray reassuring.  Negative d-dimer twice previously for similar pain.  Unclear cause.  Discussed with patient and her she will take her Nexium a little more frequently.  Has follow-up with PCP and given cardiology resources.  Doubt a severe cardiac cause of this however.  Final Clinical Impressions(s) / ED Diagnoses   Final diagnoses:  Nonspecific chest pain  Hypertension, unspecified type  Hypokalemia    ED Discharge Orders    None       Benjiman Core, MD 04/18/18 [phone]

## 2018-04-27 ENCOUNTER — Encounter: Payer: Self-pay | Admitting: Family Medicine

## 2018-04-27 ENCOUNTER — Ambulatory Visit: Payer: 59 | Admitting: Family Medicine

## 2018-04-27 VITALS — BP 152/112 | HR 88 | Temp 99.2°F | Ht 68.0 in | Wt 222.2 lb

## 2018-04-27 DIAGNOSIS — F411 Generalized anxiety disorder: Secondary | ICD-10-CM | POA: Diagnosis not present

## 2018-04-27 DIAGNOSIS — I10 Essential (primary) hypertension: Secondary | ICD-10-CM

## 2018-04-27 MED ORDER — HYDROXYZINE HCL 25 MG PO TABS: 25.0000 mg | ORAL_TABLET | Freq: Three times a day (TID) | ORAL | 1 refills | Status: DC | PRN

## 2018-04-27 MED ORDER — AMLODIPINE BESYLATE 5 MG PO TABS: 5.0000 mg | ORAL_TABLET | Freq: Every day | ORAL | 3 refills | Status: DC

## 2018-04-27 MED ORDER — SERTRALINE HCL 50 MG PO TABS: 50.0000 mg | ORAL_TABLET | Freq: Every day | ORAL | 3 refills | Status: DC

## 2018-04-27 NOTE — Progress Notes (Signed)
Pre visit review using our clinic review tool, if applicable. No additional management support is needed unless otherwise documented below in the visit note. 

## 2018-04-27 NOTE — Progress Notes (Signed)
Chief Complaint  Patient presents with  . New Patient (Initial Visit)       New Patient Visit SUBJECTIVE: HPI: Julie Kennedy is an 32 y.o.female who is being seen for establishing care.  Past couple mo has been having high blood pressure. Mom dx'd w high pressure in mid 30's presenting with same symptoms like chest pain and neck pain. Mom is on amlodipine.  She denies any snoring or recent weight changes.  Her diet is healthy overall.  She does exercise and stays physically active through work.  She denies any chest pain or shortness of breath.  She has a history of anxiety. Failed Citalopram and Xanax. Does not want to be groggy. Works 65 hrs/week and is a single mother.  Due to her schedule, she is not following with a counselor or psychologist.  Her mother also has a history of anxiety.  She is just on hydroxyzine at this time.   No Known Allergies  Past Medical History:  Diagnosis Date  . Anxiety   . Depression   . Frequent headaches   . Hard of hearing   . Hypertension   . UTI (urinary tract infection)    Past Surgical History:  Procedure Laterality Date  . CESAREAN SECTION    . TONSILLECTOMY AND ADENOIDECTOMY     Family History  Problem Relation Age of Onset  . Arthritis Mother   . Depression Mother   . Hypertension Mother    No Known Allergies  Current Outpatient Medications:  .  ibuprofen (ADVIL,MOTRIN) 600 MG tablet, Take 600 mg by mouth every 6 (six) hours as needed., Disp: , Rfl:  .  multivitamin (THERAGRAN) per tablet, Take 1 tablet by mouth daily., Disp: , Rfl:  .  amLODipine (NORVASC) 5 MG tablet, Take 1 tablet (5 mg total) by mouth daily., Disp: 90 tablet, Rfl: 3 .  hydrOXYzine (ATARAX/VISTARIL) 25 MG tablet, Take 1-3 tablets (25-75 mg total) by mouth 3 (three) times daily as needed for anxiety., Disp: 90 tablet, Rfl: 1 .  sertraline (ZOLOFT) 50 MG tablet, Take 1 tablet (50 mg total) by mouth daily. Take 1/2 tab daily for first 2 weeks., Disp: 30 tablet,  Rfl: 3  Patient's last menstrual period was 03/31/2018.  ROS Cardiovascular: Denies chest pain  Respiratory: Denies dyspnea   OBJECTIVE: BP (!) 152/112 (BP Location: Left Arm, Patient Position: Sitting, Cuff Size: Large)   Pulse 88   Temp 99.2 F (37.3 C) (Oral)   Ht 5\' 8"  (1.727 m)   Wt 222 lb 4 oz (100.8 kg)   LMP 03/31/2018   SpO2 98%   BMI 33.79 kg/m   Constitutional: -  VS reviewed -  Well developed, well nourished, appears stated age -  No apparent distress  Psychiatric: -  Oriented to person, place, and time -  Memory intact -  Affect and mood normal -  Fluent conversation, good eye contact -  Judgment and insight age appropriate  Eye: -  Conjunctivae clear, no discharge -  Pupils symmetric, round, reactive to light  ENMT: -  MMM    Pharynx moist, no exudate, no erythema  Neck: -  No gross swelling, no palpable masses -  Thyroid midline, not enlarged, mobile, no palpable masses  Cardiovascular: -  RRR -  No LE edema  Respiratory: -  Normal respiratory effort, no accessory muscle use, no retraction -  Breath sounds equal, no wheezes, no ronchi, no crackles  Skin: -  No significant lesion on inspection -  Warm and dry to palpation   ASSESSMENT/PLAN: Essential hypertension - Plan: amLODipine (NORVASC) 5 MG tablet  GAD (generalized anxiety disorder) - Plan: sertraline (ZOLOFT) 50 MG tablet, hydrOXYzine (ATARAX/VISTARIL) 25 MG tablet  Start Norvasc.  Counseled on diet and exercise.  This may be more genetic than anything at this point.  Will consider labs in the future. Start Zoloft, half tab daily for 2 weeks.  Hydroxyzine as needed.  Number for counseling also provided in AVS, however I do not expect her to start with this given her schedule. Patient should return in 6 weeks to reck. The patient voiced understanding and agreement to the plan.   Jilda Rocheicholas Paul AlmaWendling, DO 04/27/18  9:57 AM

## 2018-04-27 NOTE — Patient Instructions (Addendum)
Please consider counseling. Contact [phone] to schedule an appointment or inquire about cost/insurance coverage.  Keep active.  Let us know if you need anything.  DASH Eating Plan DASH stands for "Dietary Approaches to Stop Hypertension." The DASH eating plan is a healthy eating plan that has been shown to reduce high blood pressure (hypertension). It may also reduce your risk for type 2 diabetes, heart disease, and stroke. The DASH eating plan may also help with weight loss. What are tips for following this plan? General guidelines  Avoid eating more than 2,300 mg (milligrams) of salt (sodium) a day. If you have hypertension, you may need to reduce your sodium intake to 1,500 mg a day.  Limit alcohol intake to no more than 1 drink a day for nonpregnant women and 2 drinks a day for men. One drink equals 12 oz of beer, 5 oz of wine, or 1 oz of hard liquor.  Work with your health care provider to maintain a healthy body weight or to lose weight. Ask what an ideal weight is for you.  Get at least 30 minutes of exercise that causes your heart to beat faster (aerobic exercise) most days of the week. Activities may include walking, swimming, or biking.  Work with your health care provider or diet and nutrition specialist (dietitian) to adjust your eating plan to your individual calorie needs. Reading food labels  Check food labels for the amount of sodium per serving. Choose foods with less than 5 percent of the Daily Value of sodium. Generally, foods with less than 300 mg of sodium per serving fit into this eating plan.  To find whole grains, look for the word "whole" as the first word in the ingredient list. Shopping  Buy products labeled as "low-sodium" or "no salt added."  Buy fresh foods. Avoid canned foods and premade or frozen meals. Cooking  Avoid adding salt when cooking. Use salt-free seasonings or herbs instead of table salt or sea salt. Check with your health care provider  or pharmacist before using salt substitutes.  Do not fry foods. Cook foods using healthy methods such as baking, boiling, grilling, and broiling instead.  Cook with heart-healthy oils, such as olive, canola, soybean, or sunflower oil. Meal planning   Eat a balanced diet that includes: ? 5 or more servings of fruits and vegetables each day. At each meal, try to fill half of your plate with fruits and vegetables. ? Up to 6-8 servings of whole grains each day. ? Less than 6 oz of lean meat, poultry, or fish each day. A 3-oz serving of meat is about the same size as a deck of cards. One egg equals 1 oz. ? 2 servings of low-fat dairy each day. ? A serving of nuts, seeds, or beans 5 times each week. ? Heart-healthy fats. Healthy fats called Omega-3 fatty acids are found in foods such as flaxseeds and coldwater fish, like sardines, salmon, and mackerel.  Limit how much you eat of the following: ? Canned or prepackaged foods. ? Food that is high in trans fat, such as fried foods. ? Food that is high in saturated fat, such as fatty meat. ? Sweets, desserts, sugary drinks, and other foods with added sugar. ? Full-fat dairy products.  Do not salt foods before eating.  Try to eat at least 2 vegetarian meals each week.  Eat more home-cooked food and less restaurant, buffet, and fast food.  When eating at a restaurant, ask that your food be prepared with  less salt or no salt, if possible. What foods are recommended? The items listed may not be a complete list. Talk with your dietitian about what dietary choices are best for you. Grains Whole-grain or whole-wheat bread. Whole-grain or whole-wheat pasta. Brown rice. [place]. Bulgur. Whole-grain and low-sodium cereals. Pita bread. Low-fat, low-sodium crackers. Whole-wheat flour tortillas. Vegetables Fresh or frozen vegetables (raw, steamed, roasted, or grilled). Low-sodium or reduced-sodium tomato and vegetable juice. Low-sodium or  reduced-sodium tomato sauce and tomato paste. Low-sodium or reduced-sodium canned vegetables. Fruits All fresh, dried, or frozen fruit. Canned fruit in natural juice (without added sugar). Meat and other protein foods Skinless chicken or [place]. Ground chicken or [place]. Pork with fat trimmed off. Fish and seafood. Egg whites. Dried beans, peas, or lentils. Unsalted nuts, nut butters, and seeds. Unsalted canned beans. Lean cuts of beef with fat trimmed off. Low-sodium, lean deli meat. Dairy Low-fat (1%) or fat-free (skim) milk. Fat-free, low-fat, or reduced-fat cheeses. Nonfat, low-sodium ricotta or cottage cheese. Low-fat or nonfat yogurt. Low-fat, low-sodium cheese. Fats and oils Soft margarine without trans fats. Vegetable oil. Low-fat, reduced-fat, or light mayonnaise and salad dressings (reduced-sodium). Canola, safflower, olive, soybean, and sunflower oils. Avocado. Seasoning and other foods Herbs. Spices. Seasoning mixes without salt. Unsalted popcorn and pretzels. Fat-free sweets. What foods are not recommended? The items listed may not be a complete list. Talk with your dietitian about what dietary choices are best for you. Grains Baked goods made with fat, such as croissants, muffins, or some breads. Dry pasta or rice meal packs. Vegetables Creamed or fried vegetables. Vegetables in a cheese sauce. Regular canned vegetables (not low-sodium or reduced-sodium). Regular canned tomato sauce and paste (not low-sodium or reduced-sodium). Regular tomato and vegetable juice (not low-sodium or reduced-sodium). Angie Fava. Olives. Fruits Canned fruit in a light or heavy syrup. Fried fruit. Fruit in cream or butter sauce. Meat and other protein foods Fatty cuts of meat. Ribs. Fried meat. Berniece Salines. Sausage. Bologna and other processed lunch meats. Salami. Fatback. Hotdogs. Bratwurst. Salted nuts and seeds. Canned beans with added salt. Canned or smoked fish. Whole eggs or egg yolks. Chicken or [place]  with skin. Dairy Whole or 2% milk, cream, and half-and-half. Whole or full-fat cream cheese. Whole-fat or sweetened yogurt. Full-fat cheese. Nondairy creamers. Whipped toppings. Processed cheese and cheese spreads. Fats and oils Butter. Stick margarine. Lard. Shortening. Ghee. Bacon fat. Tropical oils, such as coconut, palm kernel, or palm oil. Seasoning and other foods Salted popcorn and pretzels. Onion salt, garlic salt, seasoned salt, table salt, and sea salt. Worcestershire sauce. Tartar sauce. Barbecue sauce. Teriyaki sauce. Soy sauce, including reduced-sodium. Steak sauce. Canned and packaged gravies. Fish sauce. Oyster sauce. Cocktail sauce. Horseradish that you find on the shelf. Ketchup. Mustard. Meat flavorings and tenderizers. Bouillon cubes. Hot sauce and Tabasco sauce. Premade or packaged marinades. Premade or packaged taco seasonings. Relishes. Regular salad dressings. Where to find more information:  National Heart, Lung, and [place]: https://wilson-eaton.com/  American Heart Association: www.heart.org Summary  The DASH eating plan is a healthy eating plan that has been shown to reduce high blood pressure (hypertension). It may also reduce your risk for type 2 diabetes, heart disease, and stroke.  With the DASH eating plan, you should limit salt (sodium) intake to 2,300 mg a day. If you have hypertension, you may need to reduce your sodium intake to 1,500 mg a day.  When on the DASH eating plan, aim to eat more fresh fruits and vegetables, whole grains, lean proteins,  low-fat dairy, and heart-healthy fats.  Work with your health care provider or diet and nutrition specialist (dietitian) to adjust your eating plan to your individual calorie needs. This information is not intended to replace advice given to you by your health care provider. Make sure you discuss any questions you have with your health care provider. Document Released: 10/28/2011 Document Revised: 11/01/2016  Document Reviewed: 11/01/2016 Elsevier Interactive Patient Education  [employer]2018 Elsevier Inc.

## 2018-05-03 ENCOUNTER — Telehealth: Payer: Self-pay | Admitting: Family Medicine

## 2018-05-03 NOTE — Telephone Encounter (Signed)
The patient called back and did inform of PCP instructions on medication.

## 2018-05-03 NOTE — Telephone Encounter (Signed)
Copied from CRM [id number]. Topic: General - Other >> May 03, 2018  2:06 PM Tamela OddiHarris, Rogelio Winbush J wrote: Reason for CRM: Patient called to speak with a nurse.  She has been overly exhausted since she changed her BP medication.  Said she is falling asleep at work which is not normal for her.  CB#[phone].

## 2018-05-03 NOTE — Telephone Encounter (Signed)
Called left message to call back 

## 2018-05-03 NOTE — Telephone Encounter (Signed)
Could take 1/2 the dose she has been and see how things go. The hydroxyzine is to be used as needed also, not scheduled in the AM.

## 2018-05-03 NOTE — Telephone Encounter (Signed)
Pt. Reports she has been very sleepy in the mornings. States she is taking her Hydroxyzine in the morning (only). It is helping her anxiety. Instructed her it could cause drowsiness and she could try taking it in the evenings. Would like Dr. Hollie BeachWendling's advise.

## 2018-05-08 ENCOUNTER — Telehealth: Payer: Self-pay

## 2018-05-08 MED ORDER — DULOXETINE HCL 30 MG PO CPEP: 30.0000 mg | ORAL_CAPSULE | Freq: Every day | ORAL | 3 refills | Status: DC

## 2018-05-08 NOTE — Telephone Encounter (Signed)
Patient called TH states she started Norvasc,Hydroxyzine and Zoloft recently and she is now having dry mounth,no appetite and irregular heartbeat. States she started meds on June 6th. Called patient to verify information and to see if she needed appointment. No answer,left message for return call. Please advise.

## 2018-05-08 NOTE — Telephone Encounter (Signed)
My blame is on the Zoloft. Let's stop this and try a different one. No change in f/u appt unless she continues to have issues. TY.

## 2018-05-08 NOTE — Addendum Note (Signed)
Addended by: Radene GunningWENDLING, NICHOLAS P on: 05/08/2018 12:28 PM   Modules accepted: Orders

## 2018-06-08 ENCOUNTER — Ambulatory Visit: Payer: 59 | Admitting: Family Medicine

## 2018-06-09 ENCOUNTER — Emergency Department (HOSPITAL_BASED_OUTPATIENT_CLINIC_OR_DEPARTMENT_OTHER): Payer: 59

## 2018-06-09 ENCOUNTER — Other Ambulatory Visit: Payer: Self-pay

## 2018-06-09 ENCOUNTER — Emergency Department (HOSPITAL_BASED_OUTPATIENT_CLINIC_OR_DEPARTMENT_OTHER)
Admission: EM | Admit: 2018-06-09 | Discharge: 2018-06-09 | Disposition: A | Discharge status: Home / Self Care | Payer: 59 | Attending: Emergency Medicine | Admitting: Emergency Medicine

## 2018-06-09 ENCOUNTER — Telehealth: Payer: Self-pay | Admitting: Medical

## 2018-06-09 ENCOUNTER — Encounter: Payer: Self-pay | Admitting: Medical

## 2018-06-09 ENCOUNTER — Ambulatory Visit (INDEPENDENT_AMBULATORY_CARE_PROVIDER_SITE_OTHER): Payer: 59 | Admitting: Medical

## 2018-06-09 ENCOUNTER — Encounter (HOSPITAL_BASED_OUTPATIENT_CLINIC_OR_DEPARTMENT_OTHER): Payer: Self-pay | Admitting: Emergency Medicine

## 2018-06-09 VITALS — BP 133/89 | HR 120 | Temp 98.9°F | Ht 68.0 in | Wt 216.0 lb

## 2018-06-09 DIAGNOSIS — K760 Fatty (change of) liver, not elsewhere classified: Secondary | ICD-10-CM | POA: Insufficient documentation

## 2018-06-09 DIAGNOSIS — R1030 Lower abdominal pain, unspecified: Secondary | ICD-10-CM | POA: Insufficient documentation

## 2018-06-09 DIAGNOSIS — R112 Nausea with vomiting, unspecified: Secondary | ICD-10-CM

## 2018-06-09 DIAGNOSIS — K8020 Calculus of gallbladder without cholecystitis without obstruction: Secondary | ICD-10-CM | POA: Diagnosis not present

## 2018-06-09 DIAGNOSIS — R509 Fever, unspecified: Secondary | ICD-10-CM | POA: Diagnosis not present

## 2018-06-09 DIAGNOSIS — R197 Diarrhea, unspecified: Secondary | ICD-10-CM | POA: Diagnosis not present

## 2018-06-09 DIAGNOSIS — J029 Acute pharyngitis, unspecified: Secondary | ICD-10-CM | POA: Diagnosis not present

## 2018-06-09 DIAGNOSIS — R1031 Right lower quadrant pain: Secondary | ICD-10-CM | POA: Diagnosis not present

## 2018-06-09 DIAGNOSIS — R1011 Right upper quadrant pain: Secondary | ICD-10-CM

## 2018-06-09 DIAGNOSIS — I10 Essential (primary) hypertension: Secondary | ICD-10-CM | POA: Diagnosis not present

## 2018-06-09 DIAGNOSIS — Z79899 Other long term (current) drug therapy: Secondary | ICD-10-CM | POA: Diagnosis not present

## 2018-06-09 DIAGNOSIS — R109 Unspecified abdominal pain: Secondary | ICD-10-CM

## 2018-06-09 LAB — COMPREHENSIVE METABOLIC PANEL
ALK PHOS: 81 U/L (ref 38–126)
ALT: 30 U/L (ref 0–44)
ANION GAP: 17 — AB (ref 5–15)
AST: 48 U/L — ABNORMAL HIGH (ref 15–41)
Albumin: 4.1 g/dL (ref 3.5–5.0)
BUN: 12 mg/dL (ref 6–20)
CALCIUM: 8.5 mg/dL — AB (ref 8.9–10.3)
CO2: 22 mmol/L (ref 22–32)
Chloride: 101 mmol/L (ref 98–111)
Creatinine, Ser: 0.48 mg/dL (ref 0.44–1.00)
GFR calc non Af Amer: >60 mL/min (ref >60)
Glucose, Bld: 122 mg/dL — ABNORMAL HIGH (ref 70–99)
Potassium: 3.3 mmol/L — ABNORMAL LOW (ref 3.5–5.1)
SODIUM: 140 mmol/L (ref 135–145)
TOTAL PROTEIN: 7.3 g/dL (ref 6.5–8.1)
Total Bilirubin: 1 mg/dL (ref 0.3–1.2)

## 2018-06-09 LAB — CBC WITH DIFFERENTIAL/PLATELET
Basophils Absolute: 0 10*3/uL (ref 0.0–0.1)
Basophils Relative: 0 %
EOS ABS: 0 10*3/uL (ref 0.0–0.7)
EOS PCT: 0 %
HCT: 42.7 % (ref 36.0–46.0)
HEMOGLOBIN: 15.2 g/dL — AB (ref 12.0–15.0)
LYMPHS ABS: 2.6 10*3/uL (ref 0.7–4.0)
Lymphocytes Relative: 34 %
MCH: 34.1 pg — AB (ref 26.0–34.0)
MCHC: 35.6 g/dL (ref 30.0–36.0)
MCV: 95.7 fL (ref 78.0–100.0)
MONOS PCT: 7 %
Monocytes Absolute: 0.5 10*3/uL (ref 0.1–1.0)
Neutro Abs: 4.6 10*3/uL (ref 1.7–7.7)
Neutrophils Relative %: 59 %
PLATELETS: 204 10*3/uL (ref 150–400)
RBC: 4.46 MIL/uL (ref 3.87–5.11)
RDW: 12.5 % (ref 11.5–15.5)
WBC: 7.8 10*3/uL (ref 4.0–10.5)

## 2018-06-09 LAB — URINALYSIS, ROUTINE W REFLEX MICROSCOPIC
BILIRUBIN URINE: NEGATIVE
GLUCOSE, UA: NEGATIVE mg/dL
KETONES UR: NEGATIVE mg/dL
LEUKOCYTES UA: NEGATIVE
Nitrite: NEGATIVE
PROTEIN: 100 mg/dL — AB
Specific Gravity, Urine: >1.03 — ABNORMAL HIGH (ref 1.005–1.030)
pH: 6 (ref 5.0–8.0)

## 2018-06-09 LAB — POC URINALSYSI DIPSTICK (AUTOMATED)
Glucose, UA: NEGATIVE
Leukocytes, UA: NEGATIVE
Nitrite, UA: NEGATIVE
PH UA: 6 (ref 5.0–8.0)
Protein, UA: POSITIVE — AB
Urobilinogen, UA: 1 E.U./dL

## 2018-06-09 LAB — URINALYSIS, MICROSCOPIC (REFLEX)

## 2018-06-09 LAB — POCT RAPID STREP A (OFFICE): RAPID STREP A SCREEN: NEGATIVE

## 2018-06-09 LAB — POCT URINE PREGNANCY: Preg Test, Ur: NEGATIVE

## 2018-06-09 MED ORDER — IOPAMIDOL (ISOVUE-300) INJECTION 61%
100.0000 mL | Freq: Once | INTRAVENOUS | Status: AC | PRN
  Administered 2018-06-09: 100 mL via INTRAVENOUS

## 2018-06-09 MED ORDER — PROCHLORPERAZINE EDISYLATE 10 MG/2ML IJ SOLN
5.0000 mg | Freq: Once | INTRAMUSCULAR | Status: AC
  Administered 2018-06-09: 5 mg via INTRAVENOUS
  Filled 2018-06-09: qty 2

## 2018-06-09 MED ORDER — ONDANSETRON HCL 4 MG PO TABS: 4.0000 mg | ORAL_TABLET | Freq: Three times a day (TID) | ORAL | 0 refills | Status: DC | PRN

## 2018-06-09 MED ORDER — HALOPERIDOL LACTATE 5 MG/ML IJ SOLN: 2.0000 mg | Freq: Once | INTRAMUSCULAR | Status: DC

## 2018-06-09 MED ORDER — FENTANYL CITRATE (PF) 100 MCG/2ML IJ SOLN
INTRAMUSCULAR | Status: AC
  Filled 2018-06-09: qty 2

## 2018-06-09 MED ORDER — ONDANSETRON HCL 4 MG/2ML IJ SOLN
4.0000 mg | Freq: Once | INTRAMUSCULAR | Status: AC
  Administered 2018-06-09: 4 mg via INTRAVENOUS
  Filled 2018-06-09: qty 2

## 2018-06-09 MED ORDER — DIPHENHYDRAMINE HCL 50 MG/ML IJ SOLN
12.5000 mg | Freq: Once | INTRAMUSCULAR | Status: AC
  Administered 2018-06-09: 12.5 mg via INTRAVENOUS
  Filled 2018-06-09: qty 1

## 2018-06-09 MED ORDER — HYDROMORPHONE HCL 1 MG/ML IJ SOLN
0.5000 mg | Freq: Once | INTRAMUSCULAR | Status: AC
  Administered 2018-06-09: 0.5 mg via INTRAVENOUS
  Filled 2018-06-09: qty 1

## 2018-06-09 MED ORDER — FENTANYL CITRATE (PF) 100 MCG/2ML IJ SOLN
50.0000 ug | Freq: Once | INTRAMUSCULAR | Status: AC
  Administered 2018-06-09: 50 ug via INTRAVENOUS
  Filled 2018-06-09: qty 2

## 2018-06-09 MED ORDER — DICYCLOMINE HCL 20 MG PO TABS: 20.0000 mg | ORAL_TABLET | Freq: Two times a day (BID) | ORAL | 0 refills | Status: DC

## 2018-06-09 MED ORDER — ACETAMINOPHEN 325 MG PO TABS
650.0000 mg | ORAL_TABLET | Freq: Once | ORAL | Status: AC
  Administered 2018-06-09: 650 mg via ORAL
  Filled 2018-06-09: qty 2

## 2018-06-09 NOTE — ED Triage Notes (Addendum)
Patient reports RLQ abdominal pain with nausea x 2 days.  States that she has also had dysuria.  Reports vomiting and diarrhea x 2 days.  Seen at PCP and referred to ER.  Per notes patient febrile at PCP but upon arrival to ED patient without fever although she appears flushed.  Patient states she did not take medication to treat fever prior to arrival to ER.

## 2018-06-09 NOTE — Patient Instructions (Addendum)
For your severe rt lower quadrant pain, nausea, vomiting and fever, I do think it would be best to be evaluated at the ED. I called downstairs to notify them of your recent signs/symptoms.  Your rapid strep test was negative. We are sending out throat culture. If your work up for acute abdomen pain is negative ED might give you antibiotic for potential strep pending your send out throat culture result.  Follow up after ED evaluation or as needed

## 2018-06-09 NOTE — ED Provider Notes (Signed)
[facility] EMERGENCY DEPARTMENT Provider Note   CSN: [id number] Arrival date & time: 06/09/18  1558     History   Chief Complaint Chief Complaint  Patient presents with  . Abdominal Pain    HPI Julie Kennedy is a 32 y.o. female.Sent by pcp to r/o Appendectomy . The patient was seen by PA earlier today because she has had 48 hours of lower quadrant abdominal pain, loose stool, malaise.  She noticed a little bit of burning with urination today.  She has had loss of appetite and noted to have a fever of 101.2 this morning.  She did not denies vaginal symptoms.  She is menstruating currently.  HPI  Past Medical History:  Diagnosis Date  . Anxiety   . Depression   . Frequent headaches   . Hard of hearing   . Hypertension     Patient Active Problem List   Diagnosis Date Noted  . Essential hypertension 04/27/2018  . GAD (generalized anxiety disorder) 04/27/2018    Past Surgical History:  Procedure Laterality Date  . CESAREAN SECTION    . TONSILLECTOMY AND ADENOIDECTOMY       OB History    Gravida  1   Para  1   Term  1   Preterm      AB      Living  1     SAB      TAB      Ectopic      Multiple      Live Births               Home Medications    Prior to Admission medications   Medication Sig Start Date End Date Taking? Authorizing Provider  amLODipine (NORVASC) 5 MG tablet Take 1 tablet (5 mg total) by mouth daily. 04/27/18   Sharlene Dory, DO  dicyclomine (BENTYL) 20 MG tablet Take 1 tablet (20 mg total) by mouth 2 (two) times daily. 06/09/18   Arthor Captain, PA-C  DULoxetine (CYMBALTA) 30 MG capsule Take 1 capsule (30 mg total) by mouth daily. 05/08/18   Sharlene Dory, DO  hydrOXYzine (ATARAX/VISTARIL) 25 MG tablet Take 1-3 tablets (25-75 mg total) by mouth 3 (three) times daily as needed for anxiety. 04/27/18   Sharlene Dory, DO  ibuprofen (ADVIL,MOTRIN) 600 MG tablet Take 600 mg by mouth every 6 (six)  hours as needed.    [provider]  multivitamin [facility]) per tablet Take 1 tablet by mouth daily.    [provider]  ondansetron (ZOFRAN) 4 MG tablet Take 1 tablet (4 mg total) by mouth every 8 (eight) hours as needed for nausea or vomiting. 06/09/18   Arthor Captain, PA-C    Family History Family History  Problem Relation Age of Onset  . Arthritis Mother   . Depression Mother   . Hypertension Mother     Social History Social History   Tobacco Use  . Smoking status: Never Smoker  . Smokeless tobacco: Never Used  Substance Use Topics  . Alcohol use: Yes    Comment: occ  . Drug use: Not Currently    Comment: pt denies a history, but cocaine listed from previous visit      Allergies   Patient has no known allergies.   Review of Systems Review of Systems  Ten systems reviewed and are negative for acute change, except as noted in the HPI.   Physical Exam Updated Vital Signs BP (!) 138/95  Pulse (!) 104   Temp 98.2 F (36.8 C) (Oral)   Resp 16   Ht 5\' 8"  (1.727 m)   Wt 97.5 kg (215 lb)   LMP 06/01/2018   SpO2 98%   BMI 32.69 kg/m   Physical Exam  Constitutional: She is oriented to person, place, and time. She appears well-developed and well-nourished. No distress.  HENT:  Head: Normocephalic and atraumatic.  Eyes: Conjunctivae are normal. No scleral icterus.  Neck: Normal range of motion.  Cardiovascular: Normal rate, regular rhythm and normal heart sounds. Exam reveals no gallop and no friction rub.  No murmur heard. Pulmonary/Chest: Effort normal and breath sounds normal. No respiratory distress.  Abdominal: Soft. Bowel sounds are normal. She exhibits no distension and no mass. There is tenderness in the right lower quadrant. There is no guarding.  Neurological: She is alert and oriented to person, place, and time.  Skin: Skin is warm and dry. She is not diaphoretic.  Psychiatric: Her behavior is normal.  Nursing note and vitals  reviewed.    ED Treatments / Results  Labs (all labs ordered are listed, but only abnormal results are displayed) Labs Reviewed  CBC WITH DIFFERENTIAL/PLATELET - Abnormal; Notable for the following components:      Result Value   Hemoglobin 15.2 (*)    MCH 34.1 (*)    All other components within normal limits  COMPREHENSIVE METABOLIC PANEL - Abnormal; Notable for the following components:   Potassium 3.3 (*)    Glucose, Bld 122 (*)    Calcium 8.5 (*)    AST 48 (*)    Anion gap 17 (*)    All other components within normal limits  URINALYSIS, ROUTINE W REFLEX MICROSCOPIC - Abnormal; Notable for the following components:   Specific Gravity, Urine >1.030 (*)    Hgb urine dipstick LARGE (*)    Protein, ur 100 (*)    All other components within normal limits  URINALYSIS, MICROSCOPIC (REFLEX) - Abnormal; Notable for the following components:   Bacteria, UA RARE (*)    All other components within normal limits    EKG None  Radiology Ct Abdomen Pelvis W Contrast  Result Date: 06/09/2018 CLINICAL DATA:  Right lower quadrant pain with fever and nausea EXAM: CT ABDOMEN AND PELVIS WITH CONTRAST TECHNIQUE: Multidetector CT imaging of the abdomen and pelvis was performed using the standard protocol following bolus administration of intravenous contrast. CONTRAST:  ISOVUE-300 IOPAMIDOL (ISOVUE-300) INJECTION 61% COMPARISON:  None. FINDINGS: Lower chest: Lung bases are clear. Hepatobiliary: There is hepatic steatosis. No focal liver lesions are evident. Gallbladder is mildly distended. There is sludge in the gallbladder with apparent tiny gallstone. There is no gallbladder wall thickening. There is no biliary duct dilatation. Pancreas: There is no pancreatic mass or inflammatory focus. Spleen: No splenic lesions are evident. Adrenals/Urinary Tract: Adrenals bilaterally appear normal. Kidneys bilaterally show no evident mass or hydronephrosis on either side. No renal or ureteral calculus on  either side. Urinary bladder is midline with wall thickness within normal limits. Stomach/Bowel: There is no appreciable bowel wall or mesenteric thickening. There are scattered left-sided colonic diverticula without diverticulitis. No bowel obstruction. No free air or portal venous air evident. Vascular/Lymphatic: No abdominal aortic aneurysm. No vascular lesions are evident. There is no adenopathy in the abdomen or pelvis. Reproductive: Uterus is anteverted. There are nabothian cysts arising from the cervix, largest measuring 1.3 x 1.2 cm. Beyond these nabothian cysts, there is no evident pelvic mass. Other: Appendix appears normal.  There is no ascites or abscess in the abdomen or pelvis. Musculoskeletal: No blastic or lytic bone lesions are evident. No intramuscular or abdominal wall lesion evident. IMPRESSION: 1. Gallbladder appears slightly distended. Sludge with tiny gallstone noted. No gallbladder wall thickening evident. 2. Left-sided colonic diverticula without diverticulitis. No bowel obstruction. No abscess in the abdomen pelvis. Appendix appears normal. 3.  Hepatic steatosis. 4.  No renal or ureteral calculus.  No hydronephrosis. 5.  Cervical nabothian cysts noted. Electronically Signed   By: Bretta BangWilliam  Woodruff III M.D.   On: 06/09/2018 16:50   [place] Abdomen Limited Ruq  Result Date: 06/09/2018 CLINICAL DATA:  Nausea and vomiting with upper abdominal pain EXAM: ULTRASOUND ABDOMEN LIMITED RIGHT UPPER QUADRANT COMPARISON:  CT abdomen and pelvis June 09, 2018 FINDINGS: Gallbladder: Within the gallbladder, there are multiple echogenic foci which move and shadow consistent with cholelithiasis. Largest gallstone measures 7 mm in length. There is no evident gallbladder wall thickening or pericholecystic fluid. No sonographic Murphy sign noted by sonographer. Common bile duct: Diameter: Mm. No evident intrahepatic or extrahepatic biliary duct dilatation. Liver: No focal lesion identified. Liver echogenicity is  increased. Portal vein is patent on color Doppler imaging with normal direction of blood flow towards the liver. IMPRESSION: 1. Cholelithiasis. No gallbladder wall thickening or pericholecystic fluid. 2. Increase in liver echogenicity, a finding indicative of hepatic steatosis. While no focal liver lesions are evident on this study, it must be cautioned that the sensitivity of ultrasound for detection of focal liver lesions is diminished in this circumstance. Electronically Signed   By: Bretta BangWilliam  Woodruff III M.D.   On: 06/09/2018 19:30    Procedures Procedures (including critical care time)  Medications Ordered in ED Medications  iopamidol (ISOVUE-300) 61 % injection 100 mL (100 mLs Intravenous Contrast Given 06/09/18 1632)  fentaNYL (SUBLIMAZE) injection 50 mcg (50 mcg Intravenous Given 06/09/18 1658)  ondansetron (ZOFRAN) injection 4 mg (4 mg Intravenous Given 06/09/18 1656)  prochlorperazine (COMPAZINE) injection 5 mg (5 mg Intravenous Given 06/09/18 1929)  diphenhydrAMINE (BENADRYL) injection 12.5 mg (12.5 mg Intravenous Given 06/09/18 1929)  HYDROmorphone (DILAUDID) injection 0.5 mg (0.5 mg Intravenous Given 06/09/18 1930)  acetaminophen (TYLENOL) tablet 650 mg (650 mg Oral Given 06/09/18 1934)     Initial Impression / Assessment and Plan / ED Course  I have reviewed the triage vital signs and the nursing notes.  Pertinent labs & imaging results that were available during my care of the patient were reviewed by me and considered in my medical decision making (see chart for details).  Clinical Course as of Jun 09 2038  Fri Jun 09, 2018  1919 Patient does not have elevated white count.  She does have a slight the elevated anion gap however suspect some mild dehydration.  Patient's CT abdomen shows no significant abnormality except for some gallbladder sludge.  I have ordered a right upper quadrant ultrasound.   [AH]    Clinical Course User Index [AH] Arthor CaptainHarris, Laney Louderback, PA-C   Patient with  finding of fatty liver, cholelithiasis, white blood cell count is normal.  Afebrile here.  Patient pain nausea have improved..  No active vomiting.  I suspect viral process.  Patient be treated with Bentyl and Zofran.  She may supplement with Tylenol.  She appears appropriate for discharge at this time may follow-up with her PCP.  Final Clinical Impressions(s) / ED Diagnoses   Final diagnoses:  Abdominal pain, unspecified abdominal location  Diarrhea, unspecified type  Calculus of gallbladder without cholecystitis without obstruction  Fatty liver    ED Discharge Orders        Ordered    dicyclomine (BENTYL) 20 MG tablet  2 times daily     06/09/18 2020    ondansetron (ZOFRAN) 4 MG tablet  Every 8 hours PRN     06/09/18 2020       Arthor Captain, PA-C 06/09/18 2039    Melene Plan, DO 06/09/18 2309

## 2018-06-09 NOTE — Progress Notes (Signed)
Subjective:    Patient ID: Julie Kennedy, female    DOB: 06/16/1986, 32 y.o.   MRN: [id number]  HPI  Pt in states past 48 hours she has some vomiting.She has vomited 4 times today. Yesterday also vomited for times. Pt did some loose stools. About 3 over last 24 hours. She states feeling weak and tired.  LMP- just recently. Start one week ago and ended yesterday. Stopped ocp 2 months ago. She has states some mild lower back pain  Hurt today when she had to urinate. Note this am rt lower quadrant pain.  Pt also has sore throat on Tuesday before she started vomiting. Pain initially even on swallowing own saliva. No diffuse body aches.   Pt in rt lower quadrant since this morning. Pain in that area enough to make her cry. State 8-9/10 pain.  Pt has no appetite for 2 days. Fever 101.2 this morning.    Review of Systems  Constitutional: Positive for fever. Negative for chills and fatigue.  HENT: Positive for sore throat.   Respiratory: Negative for cough, chest tightness and wheezing.   Cardiovascular: Negative for chest pain and palpitations.  Gastrointestinal: Positive for nausea and vomiting. Negative for abdominal pain.       Appetite decreased.  Genitourinary: Negative for difficulty urinating, frequency and pelvic pain.       Back pain mentioned today when she urinated.  Musculoskeletal: Positive for back pain.  Skin: Negative for rash.  Neurological: Negative for dizziness, seizures, light-headedness and headaches.  Hematological: Negative for adenopathy. Does not bruise/bleed easily.  Psychiatric/Behavioral: Negative for behavioral problems and confusion.    Past Medical History:  Diagnosis Date  . Anxiety   . Depression   . Frequent headaches   . Hard of hearing   . Hypertension      Social History   Socioeconomic History  . Marital status: Single    Spouse name: Not on file  . Number of children: Not on file  . Years of education: Not on file  . Highest education  level: Not on file  Occupational History  . Not on file  Social Needs  . Financial resource strain: Not on file  . Food insecurity:    Worry: Not on file    Inability: Not on file  . Transportation needs:    Medical: Not on file    Non-medical: Not on file  Tobacco Use  . Smoking status: Never Smoker  . Smokeless tobacco: Never Used  Substance and Sexual Activity  . Alcohol use: Yes    Comment: occ  . Drug use: Not Currently    Comment: pt denies a history, but cocaine listed from previous visit   . Sexual activity: Not Currently  Lifestyle  . Physical activity:    Days per week: Not on file    Minutes per session: Not on file  . Stress: Not on file  Relationships  . Social connections:    Talks on phone: Not on file    Gets together: Not on file    Attends religious service: Not on file    Active member of club or organization: Not on file    Attends meetings of clubs or organizations: Not on file    Relationship status: Not on file  . Intimate partner violence:    Fear of current or ex partner: Not on file    Emotionally abused: Not on file    Physically abused: Not on file  Forced sexual activity: Not on file  Other Topics Concern  . Not on file  Social History Narrative  . Not on file    Past Surgical History:  Procedure Laterality Date  . CESAREAN SECTION    . TONSILLECTOMY AND ADENOIDECTOMY      Family History  Problem Relation Age of Onset  . Arthritis Mother   . Depression Mother   . Hypertension Mother     No Known Allergies  Current Outpatient Medications on File Prior to Visit  Medication Sig Dispense Refill  . amLODipine (NORVASC) 5 MG tablet Take 1 tablet (5 mg total) by mouth daily. 90 tablet 3  . DULoxetine (CYMBALTA) 30 MG capsule Take 1 capsule (30 mg total) by mouth daily. 30 capsule 3  . hydrOXYzine (ATARAX/VISTARIL) 25 MG tablet Take 1-3 tablets (25-75 mg total) by mouth 3 (three) times daily as needed for anxiety. 90 tablet 1  .  ibuprofen (ADVIL,MOTRIN) 600 MG tablet Take 600 mg by mouth every 6 (six) hours as needed.    . multivitamin (THERAGRAN) per tablet Take 1 tablet by mouth daily.     No current facility-administered medications on file prior to visit.     There were no vitals taken for this visit.      Objective:   Physical Exam  General  Mental Status - Alert. General Appearance - Well groomed. Not in acute distress.  Skin Rashes- No Rashes.  HEENT Head- Normal. Ear Auditory Canal - Left- Normal. Right - Normal.Tympanic Membrane- Left- Normal. Right- Normal. Eye Sclera/Conjunctiva- Left- Normal. Right- Normal. Nose & Sinuses Nasal Mucosa- Left-  Boggy and Congested. Right-  Boggy and  Congested.Bilateral maxillary and frontal sinus pressure. Mouth & Throat Lips: Upper Lip- Normal: no dryness, cracking, pallor, cyanosis, or vesicular eruption. Lower Lip-Normal: no dryness, cracking, pallor, cyanosis or vesicular eruption. Buccal Mucosa- Bilateral- No Aphthous ulcers. Oropharynx- No Discharge or Erythema. Tonsils: Characteristics- Bilateral-  Erythema or Congestion. Size/Enlargement- Bilateral- No enlargement. Discharge- bilateral-None.  Neck Neck- Supple. No Masses. Mild enlarged submandibular node enlarged.  Chest and Lung Exam Auscultation: Breath Sounds:-Clear even and unlabored.  Cardiovascular Auscultation:Rythm- Regular, rate and rhythm. Murmurs & Other Heart Sounds:Ausculatation of the heart reveal- No Murmurs.  Lymphatic Head & Neck General Head & Neck Lymphatics: Bilateral: Description- No Localized lymphadenopathy. See neck exam.       Assessment & Plan:  For your severe rt lower quadrant pain, nausea, vomiting and fever, I do think it would be best to be evaluated at the ED. I called downstairs to notify them of your recent signs/symptoms.  Your rapid strep test was negative. We are sending out throat culture. If your work up for acute abdomen pain is negative ED  might give you antibiotic for potential strep pending your send out throat culture result.  Follow up after ED evaluation or as needed

## 2018-06-09 NOTE — ED Notes (Signed)
Pt c/o pain and nausea. PA made aware, awaiting orders.

## 2018-06-09 NOTE — Telephone Encounter (Signed)
I remember you telling me flu test was negative but you did not result it by end of the day. Had to delete to close chart. Please result that by Monday.

## 2018-06-09 NOTE — Discharge Instructions (Addendum)
Get help right away if: °You have chest pain. °You feel extremely weak or you faint. °You see blood in your vomit. °Your vomit looks like coffee grounds. °You have bloody or black stools or stools that look like tar. °You have a severe headache, a stiff neck, or both. °You have a rash. °You have severe pain, cramping, or bloating in your abdomen. °You have trouble breathing or you are breathing very quickly. °Your heart is beating very quickly. °Your skin feels cold and clammy. °You feel confused. °You have pain when you urinate. °You have signs of dehydration, such as: °Dark urine, very little urine, or no urine. °Cracked lips. °Dry mouth. °Sunken eyes. °Sleepiness. °Weakness. °

## 2018-06-10 ENCOUNTER — Encounter: Payer: Self-pay | Admitting: Family Medicine

## 2018-06-10 ENCOUNTER — Encounter: Payer: Self-pay | Admitting: Medical

## 2018-06-11 LAB — CULTURE, GROUP A STREP
MICRO NUMBER: 90857731
SPECIMEN QUALITY:: ADEQUATE

## 2018-06-12 ENCOUNTER — Telehealth: Payer: Self-pay | Admitting: Medical

## 2018-06-12 ENCOUNTER — Telehealth: Payer: Self-pay

## 2018-06-12 LAB — URINE CULTURE
MICRO NUMBER: 90857864
SPECIMEN QUALITY: ADEQUATE

## 2018-06-12 MED ORDER — AMOXICILLIN-POT CLAVULANATE 875-125 MG PO TABS: 1.0000 | ORAL_TABLET | Freq: Two times a day (BID) | ORAL | 0 refills | Status: DC

## 2018-06-12 NOTE — Telephone Encounter (Signed)
rx augmentin sent to pharmacy for uti.

## 2018-06-12 NOTE — Telephone Encounter (Signed)
Author phoned pt. to update on augmentin rx and to revisit how pt. Is feeling. Pt. Stated she would pick up rx and that she plans to see Dr. Carmelia RollerWendling this Wednesday 7/24 to follow up.

## 2018-06-12 NOTE — Telephone Encounter (Signed)
-----   Message from Esperanza RichtersEdward Saguier, PA-C sent at 06/12/2018 12:55 PM EDT ----- Sensitivity test came back showing bacteria has some sensitivity to augmentin. So will go ahead and prescribe that for 7 days.

## 2018-06-14 ENCOUNTER — Encounter: Payer: Self-pay | Admitting: Family Medicine

## 2018-06-14 ENCOUNTER — Ambulatory Visit: Payer: 59 | Admitting: Family Medicine

## 2018-06-14 VITALS — BP 118/78 | HR 72 | Temp 98.4°F | Resp 16 | Ht 68.0 in | Wt 221.0 lb

## 2018-06-14 DIAGNOSIS — K8020 Calculus of gallbladder without cholecystitis without obstruction: Secondary | ICD-10-CM | POA: Diagnosis not present

## 2018-06-14 LAB — POCT INFLUENZA A/B
INFLUENZA B, POC: NEGATIVE
Influenza A, POC: NEGATIVE

## 2018-06-14 NOTE — Addendum Note (Signed)
Addended by: Wilford CornerSIERRA, Aarianna Hoadley M on: 06/14/2018 02:35 PM   Modules accepted: Orders

## 2018-06-14 NOTE — Patient Instructions (Signed)
If you do not hear anything about your referral in the next week or so, call our office and ask for an update.  Continue to avoid fatty foods when able.  Things to look out for: fevers, worsening symptoms, uncontrollable nausea/vomiting.   Let us know if you need anything.

## 2018-06-14 NOTE — Progress Notes (Signed)
CC: Abd pain  Julie Kennedy is here for abdominal pain.  Duration: 1 week Palliation: None Provocation: Greasy/fatty foods Associated symptoms: fever, nausea and postprandial pain Denies: current diarrhea, bleeding, recent abx use previous to episodes Treatment to date: Bentyl, Zofran Pt notes her mother had her gall bladder removed when she was 26 and symptoms resolved. She had pain lower on abd also.   ROS: Constitutional: No fevers GI: as noted in HPI  Past Medical History:  Diagnosis Date  . Anxiety   . Depression   . Frequent headaches   . Hard of hearing   . Hypertension    BP 118/78 (BP Location: Right Arm, Patient Position: Sitting, Cuff Size: Normal)   Pulse 72   Temp 98.4 F (36.9 C)   Resp 16   Ht 5\' 8"  (1.727 m)   Wt 221 lb (100.2 kg)   LMP 06/01/2018   BMI 33.60 kg/m  Gen.: Awake, alert, appears stated age HEENT: Mucous membranes moist without mucosal lesions Heart: Regular rate and rhythm without murmurs Lungs: Clear auscultation bilaterally, no rales or wheezing, normal effort without accessory muscle use. Abdomen: Bowel sounds are present. Abdomen is soft, TTP over R middle and lower area of abd, nondistended, no masses or organomegaly. Negative Murphy's, Rovsing's, McBurney's, and Carnett's sign. Psych: Age appropriate judgment and insight. Normal mood and affect.  Gall stones - Plan: Ambulatory referral to General Surgery  Given famhx and presentation, I think having the GS team see her is reasonable to discuss removal of GB.  Try to avoid fatty foods.  Warning signs and symptoms verbalized and written down in AVS.  F/u prn. Pt voiced understanding and agreement to the plan.  Jilda Rocheicholas Paul AlfarataWendling, DO 06/14/18 2:06 PM

## 2018-06-14 NOTE — Telephone Encounter (Signed)
Test order and completed.

## 2018-06-19 NOTE — Telephone Encounter (Signed)
Received FMLA/STD paperwork from [employer], contact Bing PlumeJamie Bryant, [phone] or fax [phone]; completed as much as possible' forwarded to provider/SLS 07/29

## 2018-07-01 ENCOUNTER — Emergency Department (HOSPITAL_BASED_OUTPATIENT_CLINIC_OR_DEPARTMENT_OTHER): Payer: 59

## 2018-07-01 ENCOUNTER — Emergency Department (HOSPITAL_BASED_OUTPATIENT_CLINIC_OR_DEPARTMENT_OTHER)
Admission: EM | Admit: 2018-07-01 | Discharge: 2018-07-01 | Disposition: A | Discharge status: Home / Self Care | Payer: 59 | Attending: Emergency Medicine | Admitting: Emergency Medicine

## 2018-07-01 ENCOUNTER — Other Ambulatory Visit: Payer: Self-pay

## 2018-07-01 ENCOUNTER — Encounter (HOSPITAL_BASED_OUTPATIENT_CLINIC_OR_DEPARTMENT_OTHER): Payer: Self-pay | Admitting: Emergency Medicine

## 2018-07-01 DIAGNOSIS — E876 Hypokalemia: Secondary | ICD-10-CM | POA: Diagnosis not present

## 2018-07-01 DIAGNOSIS — K8020 Calculus of gallbladder without cholecystitis without obstruction: Secondary | ICD-10-CM | POA: Insufficient documentation

## 2018-07-01 DIAGNOSIS — Z79899 Other long term (current) drug therapy: Secondary | ICD-10-CM | POA: Diagnosis not present

## 2018-07-01 DIAGNOSIS — F329 Major depressive disorder, single episode, unspecified: Secondary | ICD-10-CM | POA: Insufficient documentation

## 2018-07-01 DIAGNOSIS — F419 Anxiety disorder, unspecified: Secondary | ICD-10-CM | POA: Diagnosis not present

## 2018-07-01 DIAGNOSIS — I10 Essential (primary) hypertension: Secondary | ICD-10-CM | POA: Diagnosis not present

## 2018-07-01 DIAGNOSIS — K760 Fatty (change of) liver, not elsewhere classified: Secondary | ICD-10-CM | POA: Insufficient documentation

## 2018-07-01 DIAGNOSIS — R0602 Shortness of breath: Secondary | ICD-10-CM

## 2018-07-01 DIAGNOSIS — R112 Nausea with vomiting, unspecified: Secondary | ICD-10-CM

## 2018-07-01 DIAGNOSIS — R1011 Right upper quadrant pain: Secondary | ICD-10-CM

## 2018-07-01 DIAGNOSIS — R197 Diarrhea, unspecified: Secondary | ICD-10-CM

## 2018-07-01 LAB — URINALYSIS, MICROSCOPIC (REFLEX)

## 2018-07-01 LAB — URINALYSIS, ROUTINE W REFLEX MICROSCOPIC
Glucose, UA: NEGATIVE mg/dL
HGB URINE DIPSTICK: NEGATIVE
KETONES UR: 15 mg/dL — AB
LEUKOCYTES UA: NEGATIVE
Nitrite: NEGATIVE
Protein, ur: 100 mg/dL — AB
Specific Gravity, Urine: >1.03 — ABNORMAL HIGH (ref 1.005–1.030)
pH: 6 (ref 5.0–8.0)

## 2018-07-01 LAB — COMPREHENSIVE METABOLIC PANEL
ALT: 25 U/L (ref 0–44)
AST: 46 U/L — AB (ref 15–41)
Albumin: 4 g/dL (ref 3.5–5.0)
Alkaline Phosphatase: 58 U/L (ref 38–126)
Anion gap: 15 (ref 5–15)
BUN: 9 mg/dL (ref 6–20)
CHLORIDE: 96 mmol/L — AB (ref 98–111)
CO2: 24 mmol/L (ref 22–32)
Calcium: 8.5 mg/dL — ABNORMAL LOW (ref 8.9–10.3)
Creatinine, Ser: 0.83 mg/dL (ref 0.44–1.00)
GFR calc Af Amer: >60 mL/min (ref >60)
GFR calc non Af Amer: >60 mL/min (ref >60)
GLUCOSE: 110 mg/dL — AB (ref 70–99)
POTASSIUM: 3.3 mmol/L — AB (ref 3.5–5.1)
Sodium: 135 mmol/L (ref 135–145)
Total Bilirubin: 0.9 mg/dL (ref 0.3–1.2)
Total Protein: 6.9 g/dL (ref 6.5–8.1)

## 2018-07-01 LAB — TROPONIN I

## 2018-07-01 LAB — CBC WITH DIFFERENTIAL/PLATELET
BASOS ABS: 0 10*3/uL (ref 0.0–0.1)
Basophils Relative: 0 %
EOS PCT: 0 %
Eosinophils Absolute: 0 10*3/uL (ref 0.0–0.7)
HCT: 40.6 % (ref 36.0–46.0)
Hemoglobin: 14.5 g/dL (ref 12.0–15.0)
Lymphocytes Relative: 13 %
Lymphs Abs: 1.9 10*3/uL (ref 0.7–4.0)
MCH: 33.7 pg (ref 26.0–34.0)
MCHC: 35.7 g/dL (ref 30.0–36.0)
MCV: 94.4 fL (ref 78.0–100.0)
MONO ABS: 0.6 10*3/uL (ref 0.1–1.0)
Monocytes Relative: 4 %
NEUTROS ABS: 11.6 10*3/uL — AB (ref 1.7–7.7)
Neutrophils Relative %: 83 %
PLATELETS: 176 10*3/uL (ref 150–400)
RBC: 4.3 MIL/uL (ref 3.87–5.11)
RDW: 12.8 % (ref 11.5–15.5)
WBC: 14.1 10*3/uL — AB (ref 4.0–10.5)

## 2018-07-01 LAB — D-DIMER, QUANTITATIVE (NOT AT ARMC): D DIMER QUANT: 0.37 ug{FEU}/mL (ref 0.00–0.50)

## 2018-07-01 LAB — LIPASE, BLOOD: Lipase: 26 U/L (ref 11–51)

## 2018-07-01 LAB — PREGNANCY, URINE: Preg Test, Ur: NEGATIVE

## 2018-07-01 MED ORDER — HYDROCODONE-ACETAMINOPHEN 5-325 MG PO TABS: 1.0000 | ORAL_TABLET | Freq: Four times a day (QID) | ORAL | 0 refills | Status: DC | PRN

## 2018-07-01 MED ORDER — MORPHINE SULFATE (PF) 4 MG/ML IV SOLN
4.0000 mg | Freq: Once | INTRAVENOUS | Status: AC
  Administered 2018-07-01: 4 mg via INTRAVENOUS
  Filled 2018-07-01: qty 1

## 2018-07-01 MED ORDER — SODIUM CHLORIDE 0.9 % IV BOLUS
1000.0000 mL | Freq: Once | INTRAVENOUS | Status: AC
Start: 2018-07-01 — End: 2018-07-01
  Administered 2018-07-01: 1000 mL via INTRAVENOUS

## 2018-07-01 MED ORDER — METOCLOPRAMIDE HCL 10 MG PO TABS: 10.0000 mg | ORAL_TABLET | Freq: Four times a day (QID) | ORAL | 0 refills | Status: DC | PRN

## 2018-07-01 MED ORDER — LORAZEPAM 2 MG/ML IJ SOLN
0.5000 mg | Freq: Once | INTRAMUSCULAR | Status: AC
  Administered 2018-07-01: 0.5 mg via INTRAVENOUS
  Filled 2018-07-01: qty 1

## 2018-07-01 MED ORDER — METOCLOPRAMIDE HCL 5 MG/ML IJ SOLN
10.0000 mg | Freq: Once | INTRAMUSCULAR | Status: AC
  Administered 2018-07-01: 10 mg via INTRAVENOUS
  Filled 2018-07-01: qty 2

## 2018-07-01 MED ORDER — POTASSIUM CHLORIDE CRYS ER 20 MEQ PO TBCR
40.0000 meq | EXTENDED_RELEASE_TABLET | Freq: Once | ORAL | Status: AC
Start: 2018-07-01 — End: 2018-07-01
  Administered 2018-07-01: 40 meq via ORAL
  Filled 2018-07-01: qty 2

## 2018-07-01 MED ORDER — ONDANSETRON HCL 4 MG/2ML IJ SOLN
4.0000 mg | Freq: Once | INTRAMUSCULAR | Status: AC
  Administered 2018-07-01: 4 mg via INTRAVENOUS
  Filled 2018-07-01: qty 2

## 2018-07-01 MED ORDER — GI COCKTAIL ~~LOC~~
30.0000 mL | Freq: Once | ORAL | Status: AC
  Administered 2018-07-01: 30 mL via ORAL
  Filled 2018-07-01: qty 30

## 2018-07-01 MED ORDER — LORAZEPAM 2 MG/ML IJ SOLN: 0.5000 mg | Freq: Once | INTRAMUSCULAR | Status: DC

## 2018-07-01 NOTE — ED Notes (Addendum)
Pt reports woke up today with left calf pain that radiated into left chest along with shortness of breath. Pt had similar incident and was seen here July 19th told she had gallstones. Pt seen by her family medical doctor but, unable to see a surgeon, to have her gallbladder removed as recommended, due to cannot afford the cost of visit. Pt reports vomiting and diarrhea all week.

## 2018-07-01 NOTE — ED Notes (Signed)
Asked patient to see if she could provide urine sample. Patient stated that she could not provide a sample at this time.

## 2018-07-01 NOTE — ED Triage Notes (Addendum)
Pt appears anxious. States she was laying in bed when she had a sudden onset of leg pain with SOB this morning. She also states she feels anxious

## 2018-07-01 NOTE — Discharge Instructions (Addendum)
Evaluation today is very reassuring, ultrasound of your gallbladder again shows gallstones but there is no evidence of acute cholecystitis in your liver function tests are stable, your labs and chest x-ray showed no evidence of an acute problem with your heart or lungs causing your shortness of breath and I am reassured that this has resolved here today.  For symptomatic management you may use pain medicine as needed in addition to the Bentyl you were to been prescribed, since Zofran does not seem to be helping with your nausea, you have been prescribed Reglan which you can use instead.  Make sure you are drinking plenty of fluids, and advancing her diet very slowly starting with clear liquids and working your way up to bland solid foods, I suspect that doing this slowly will help with your nausea as well.  Please follow-up with [facility] on Tuesday as planned.  Return to the emergency department if in the meantime you develop significantly worsening abdominal pain, persistent vomiting and or unable to keep down any of your medications, fevers or any other new or concerning symptoms.

## 2018-07-01 NOTE — ED Provider Notes (Signed)
[facility] EMERGENCY DEPARTMENT Provider Note   CSN: [id number] Arrival date & time: 07/01/18  1325     History   Chief Complaint Chief Complaint  Patient presents with  . Shortness of Breath  . Anxiety    HPI Julie Kennedy is a 32 y.o. female.  Julie Kennedy is a 32 y.o. Female with a history of hypertension, frequent headaches, depression and anxiety, who presents to the emergency department for evaluation of shortness of breath and pain in the left calf that started at approximately 10 AM this morning.  Patient denies history of blood clot or PE in the past, did recently discontinue after being on combination OCPs for a very long time.  No recent long distance travel or surgeries.  Patient reports associated chest tightness.  She also has continued to complain of right upper quadrant pain, she was seen on July 19 for evaluation of abdominal pain and told she had gallstones without signs of cholecystitis at the time, is been using Bentyl and Zofran intermittently, reports over the past week her pain has gotten worse again she is been having lots of light brown loose stools and nausea and vomiting, no hematemesis.  Patient denies fevers or chills.  She has not had any cough.  She has not noticed leg swelling to seated with this calf pain, but calf pain or shortness of breath has been persistent and constant since 10 AM this morning.  She does endorse some anxiety as she googled what the symptoms could cause and saw information about blood clots, patient took some Vistaril for her anxiety, but this did not seem to help, long history of anxiety as well.  She has not yet had outpatient follow-up regarding her gallstones, but has an upcoming appointment on Tuesday with [facility].     Past Medical History:  Diagnosis Date  . Anxiety   . Depression   . Frequent headaches   . Hard of hearing   . Hypertension     Patient Active Problem List   Diagnosis Date  Noted  . Essential hypertension 04/27/2018  . GAD (generalized anxiety disorder) 04/27/2018    Past Surgical History:  Procedure Laterality Date  . CESAREAN SECTION    . TONSILLECTOMY AND ADENOIDECTOMY       OB History    Gravida  1   Para  1   Term  1   Preterm      AB      Living  1     SAB      TAB      Ectopic      Multiple      Live Births               Home Medications    Prior to Admission medications   Medication Sig Start Date End Date Taking? Authorizing Provider  dicyclomine (BENTYL) 20 MG tablet Take 20 mg by mouth every 6 (six) hours.   Yes [provider]  ibuprofen (ADVIL,MOTRIN) 600 MG tablet Take 600 mg by mouth every 6 (six) hours as needed.   Yes [provider]  HYDROcodone-acetaminophen (NORCO/VICODIN) 5-325 MG tablet Take 1 tablet by mouth every 6 (six) hours as needed. 07/01/18   Dartha Lodge, PA-C  hydrOXYzine (ATARAX/VISTARIL) 25 MG tablet Take 1-3 tablets (25-75 mg total) by mouth 3 (three) times daily as needed for anxiety. 04/27/18   Sharlene Dory, DO  metoCLOPramide (REGLAN) 10 MG tablet Take 1  tablet (10 mg total) by mouth every 6 (six) hours as needed for nausea (nausea/headache). 07/01/18   Dartha LodgeFord, Valor Quaintance N, PA-C  multivitamin [facility](THERAGRAN) per tablet Take 1 tablet by mouth daily.    [provider]  ondansetron (ZOFRAN) 4 MG tablet Take 1 tablet (4 mg total) by mouth every 8 (eight) hours as needed for nausea or vomiting. 06/09/18   Arthor CaptainHarris, Abigail, PA-C    Family History Family History  Problem Relation Age of Onset  . Arthritis Mother   . Depression Mother   . Hypertension Mother     Social History Social History   Tobacco Use  . Smoking status: Never Smoker  . Smokeless tobacco: Never Used  Substance Use Topics  . Alcohol use: Yes    Comment: occ  . Drug use: Not Currently    Comment: pt denies a history, but cocaine listed from previous visit      Allergies   Patient has no  known allergies.   Review of Systems Review of Systems  Constitutional: Negative for chills and fever.  HENT: Negative for congestion, rhinorrhea and sore throat.   Eyes: Negative for visual disturbance.  Respiratory: Positive for chest tightness and shortness of breath. Negative for cough and wheezing.   Cardiovascular: Positive for chest pain. Negative for palpitations and leg swelling.  Gastrointestinal: Positive for abdominal pain, diarrhea, nausea and vomiting. Negative for blood in stool.  Genitourinary: Negative for dysuria and frequency.  Musculoskeletal: Positive for myalgias. Negative for arthralgias. Back pain: Left calf pain.  Skin: Negative for color change and rash.  Neurological: Negative for dizziness, syncope and light-headedness.     Physical Exam Updated Vital Signs BP (!) 137/97   Pulse (!) 109   Temp 98.6 F (37 C) (Oral)   Resp (!) 26   Ht 5\' 8"  (1.727 m)   Wt 97.5 kg   LMP 06/10/2018   SpO2 100%   BMI 32.69 kg/m   Physical Exam  Constitutional: She appears well-developed and well-nourished.  Non-toxic appearance. She does not appear ill. No distress.  Patient appears very anxious and uncomfortable, but is in no acute distress  HENT:  Head: Normocephalic and atraumatic.  Mouth/Throat: Oropharynx is clear and moist.  Eyes: Right eye exhibits no discharge. Left eye exhibits no discharge.  Neck: Normal range of motion. Neck supple.  Cardiovascular: Normal rate, regular rhythm, normal heart sounds and intact distal pulses.  Pulmonary/Chest: No stridor. No respiratory distress. She has no wheezes. She has no rales. She exhibits no tenderness.  Patient is mildly tachypneic with slight increase in work of breathing, she does appear very anxious, respirations are equal, lungs clear to auscultation throughout without wheezes, rales or rhonchi.  Patient able to speak in full sentences without difficulty.  100% SPO2 on room air  Abdominal: Soft. Bowel sounds are  normal. She exhibits no distension and no mass. There is tenderness. There is guarding.  Abdomen soft, nondistended, there is a small erythematous area in the middle abdomen, or patient reports a pair of scissors slipped and caused a superficial cut in the skin, that appears to be healing well.  Bowel sounds present throughout, there is some mild generalized tenderness, but focal tenderness in the right upper quadrant with guarding and positive Murphy sign, no CVA tenderness bilaterally  Musculoskeletal:  Mild tenderness to palpation over the left posterior calf without overlying erythema or swelling, no palpable cord, right lower extremity is unremarkable  Neurological: She is alert. Coordination normal.  Skin: Skin  is warm and dry. Capillary refill takes less than 2 seconds. She is not diaphoretic.  Psychiatric: She has a normal mood and affect. Her behavior is normal.  Nursing note and vitals reviewed.    ED Treatments / Results  Labs (all labs ordered are listed, but only abnormal results are displayed) Labs Reviewed  CBC WITH DIFFERENTIAL/PLATELET - Abnormal; Notable for the following components:      Result Value   WBC 14.1 (*)    Neutro Abs 11.6 (*)    All other components within normal limits  COMPREHENSIVE METABOLIC PANEL - Abnormal; Notable for the following components:   Potassium 3.3 (*)    Chloride 96 (*)    Glucose, Bld 110 (*)    Calcium 8.5 (*)    AST 46 (*)    All other components within normal limits  URINALYSIS, ROUTINE W REFLEX MICROSCOPIC - Abnormal; Notable for the following components:   APPearance CLOUDY (*)    Specific Gravity, Urine >1.030 (*)    Bilirubin Urine SMALL (*)    Ketones, ur 15 (*)    Protein, ur 100 (*)    All other components within normal limits  URINALYSIS, MICROSCOPIC (REFLEX) - Abnormal; Notable for the following components:   Bacteria, UA MANY (*)    All other components within normal limits  LIPASE, BLOOD  TROPONIN I  D-DIMER,  QUANTITATIVE (NOT AT [facility])  PREGNANCY, URINE    EKG EKG Interpretation  Date/Time:  Saturday July 01 2018 13:49:32 EDT Ventricular Rate:  110 PR Interval:    QRS Duration: 75 QT Interval:  326 QTC Calculation: 441 R Axis:   99 Text Interpretation:  Sinus tachycardia Borderline right axis deviation Confirmed by Vanetta Mulders [phone]) on 07/01/2018 2:25:50 PM   Radiology Dg Chest 2 View  Result Date: 07/01/2018 CLINICAL DATA:  Left upper chest pain radiating to the left arm. Shortness of breath. Left leg pain. EXAM: CHEST - 2 VIEW COMPARISON:  04/18/2018. FINDINGS: Normal sized heart. Clear lungs with normal vascularity. Minimal thoracic spine degenerative changes. IMPRESSION: No acute abnormality. Electronically Signed   By: Beckie Salts M.D.   On: 07/01/2018 13:59   US Abdomen Limited Ruq  Result Date: 07/01/2018 CLINICAL DATA:  Worsening upper and lower abdominal pain, nausea, vomiting, diarrhea and fever. Chest pain and shortness of breath. EXAM: ULTRASOUND ABDOMEN LIMITED RIGHT UPPER QUADRANT COMPARISON:  06/09/2018. FINDINGS: Gallbladder: Again demonstrated are multiple gallstones in the gallbladder measuring up to 6 mm in maximum diameter each. No gallbladder wall thickening or pericholecystic fluid. No sonographic Murphy sign. Common bile duct: Diameter: 2.2 mm Liver: Diffusely echogenic. Portal vein is patent on color Doppler imaging with normal direction of blood flow towards the liver. IMPRESSION: 1. Cholelithiasis without evidence of cholecystitis. 2. The liver remains diffusely echogenic, most likely due to steatosis. Electronically Signed   By: Beckie Salts M.D.   On: 07/01/2018 15:59    Procedures Procedures (including critical care time)  Medications Ordered in ED Medications  LORazepam (ATIVAN) injection 0.5 mg (has no administration in time range)  sodium chloride 0.9 % bolus 1,000 mL ( Intravenous Stopped 07/01/18 1552)  ondansetron (ZOFRAN) injection 4 mg (4 mg  Intravenous Given 07/01/18 1445)  morphine 4 MG/ML injection 4 mg (4 mg Intravenous Given 07/01/18 1450)  LORazepam (ATIVAN) injection 0.5 mg (0.5 mg Intravenous Given 07/01/18 1447)  gi cocktail (Maalox,Lidocaine,Donnatal) (30 mLs Oral Given 07/01/18 1634)  metoCLOPramide (REGLAN) injection 10 mg (10 mg Intravenous Given 07/01/18 1635)  potassium chloride  SA (K-DUR,KLOR-CON) CR tablet 40 mEq (40 mEq Oral Given 07/01/18 1633)     Initial Impression / Assessment and Plan / ED Course  I have reviewed the triage vital signs and the nursing notes.  Pertinent labs & imaging results that were available during my care of the patient were reviewed by me and considered in my medical decision making (see chart for details).  Patient presents for evaluation of shortness of breath that started this morning with associated left calf pain without swelling.  Patient also reports right upper quadrant abdominal pain with associated nausea, vomiting and diarrhea, diagnosed with gallstones in July without evidence of acute cholecystitis.  On arrival patient appears extremely anxious, mildly tachypneic, tachycardic to the 110s, and hypertensive.  Lungs clear to auscultation on exam, heart with regular rhythm.  Abdomen with focal moderate tenderness in the right upper quadrant with guarding.  Given patient's presentation and abnormal vital signs, abdominal labs as well as troponin, d-dimer, EKG, chest x-ray and right upper quadrant ultrasound ordered.  Given patient's tachycardia, and pain that started in her left calf with subsequent chest discomfort and shortness of breath, there is some concern for PE, cannot be ruled out with PERC criteria.  Given patient's known gallstones her worsening symptoms over the past week with associated right upper quadrant tenderness concerning for possible cholecystitis. I have low suspicion for ACS as symptoms are very atypical, and patient has very few cardiac risk factors.  EKG without  concerning ischemic changes, troponin negative.  Patient does have a leukocytosis of 14.1 with a left shift, normal hemoglobin.  Mild hypokalemia of 3.3, which is not surprising in the setting of diarrhea, replaced with oral potassium here in the ED, no other acute electrolyte derangements, normal renal function, LFTs unremarkable and unchanged from prior evaluation for gallstones in July.  Urinalysis some protein and small amount of ketones, there are many bacteria present in the urine but no leukocytes or nitrites and no WBCs, there are many squamous epithelial cells present I favor this being more so due to contamination than true infection.  Normal lipase.  D-dimer within normal limits.  Chest x-ray shows no active cardiopulmonary disease, images reviewed myself and I am in agreement with radiology report.  Right upper quadrant ultrasound again exhibits gallstones, the largest approximately 6 mm, there is no evidence of acute cholecystitis, common bile duct is within normal limits.  Though patient does have leukocytosis she has no elevation in her LFTs and symptoms have significantly improved with management here in the ED.  No further vomiting and improvement in pain.  Patient tachypnea has also improved, her blood pressure has steadily decreased, she remains mildly tachycardic but continues to be very anxious.  Do not feel the patient will require acute surgical consultation without any signs of cholecystitis on ultrasound.  She has follow-up with general surgery in 3 days.  Return precautions discussed.  Will discharge with small amount of pain medication and Reglan as this seemed to better control her nausea than Zofran.  She expresses understanding and is in agreement with this plan.  Final Clinical Impressions(s) / ED Diagnoses   Final diagnoses:  RUQ pain  Calculus of gallbladder without cholecystitis without obstruction  Nausea vomiting and diarrhea  Shortness of breath  Anxiety  Hypokalemia    Hepatic steatosis    ED Discharge Orders         Ordered    HYDROcodone-acetaminophen (NORCO/VICODIN) 5-325 MG tablet  Every 6 hours PRN  07/01/18 1649    metoCLOPramide (REGLAN) 10 MG tablet  Every 6 hours PRN     07/01/18 1649           Dartha Lodge, PA-C 07/02/18 Virginia Rochester, MD 07/04/18 [phone]

## 2018-07-04 ENCOUNTER — Ambulatory Visit: Payer: Self-pay | Admitting: General Surgery

## 2018-07-05 NOTE — Progress Notes (Signed)
07-03-18 (Epic) EKG  07-01-18 (Epic) CXR

## 2018-07-05 NOTE — Patient Instructions (Addendum)
Julie Kennedy  07/05/2018   Your procedure is scheduled on: 07-07-18    Report to [facility] Main  Entrance    Report to Admitting at 5:30 AM    Call this number if you have problems the morning of surgery [phone]    Remember: NO SOLID FOOD AFTER MIDNIGHT THE NIGHT PRIOR TO SURGERY. NOTHING BY MOUTH EXCEPT CLEAR LIQUIDS UNTIL 3 HOURS PRIOR TO SCHEULED SURGERY. PLEASE FINISH ENSURE DRINK PER SURGEON ORDER 3 HOURS PRIOR TO SCHEDULED SURGERY TIME WHICH NEEDS TO BE COMPLETED AT 4:15 AM .   CLEAR LIQUID DIET   Foods Allowed                                                                     Foods Excluded  Coffee and tea, regular and decaf                             liquids that you cannot  Plain Jell-O in any flavor                                             see through such as: Fruit ices (not with fruit pulp)                                     milk, soups, orange juice  Iced Popsicles                                    All solid food Carbonated beverages, regular and diet                                    Cranberry, grape and apple juices Sports drinks like Gatorade Lightly seasoned clear broth or consume(fat free) Sugar, honey syrup  Sample Menu Breakfast                                Lunch                                     Supper Cranberry juice                    Beef broth                            Chicken broth Jell-O                                     Grape juice  Apple juice Coffee or tea                        Jell-O                                      Popsicle                                                Coffee or tea                        Coffee or tea  _____________________________________________________________________      Take these medicines the morning of surgery with A SIP OF WATER: Amlodipine (Norvasc), and Sertraline (Zoloft). You may also take your anxiety medication as needed.                       You may not have any metal on your body including hair pins and              piercings  Do not wear jewelry, make-up, lotions, powders or perfumes, deodorant             Do not wear nail polish.  Do not shave  48 hours prior to surgery.     Do not bring valuables to the hospital. [facility] IS NOT             RESPONSIBLE   FOR VALUABLES.  Contacts, dentures or bridgework may not be worn into surgery.     Patients discharged the day of surgery will not be allowed to drive home.  Name and phone number of your driver: Julie Kennedy [phone]                Please read over the following fact sheets you were given: _____________________________________________________________________             [facility] Before surgery, you can play an important role.  Because skin is not sterile, your skin needs to be as free of germs as possible.  You can reduce the number of germs on your skin by washing with CHG (chlorahexidine gluconate) soap before surgery.  CHG is an antiseptic cleaner which kills germs and bonds with the skin to continue killing germs even after washing. Please DO NOT use if you have an allergy to CHG or antibacterial soaps.  If your skin becomes reddened/irritated stop using the CHG and inform your nurse when you arrive at Short Stay. Do not shave (including legs and underarms) for at least 48 hours prior to the first CHG shower.  You may shave your face/neck. Please follow these instructions carefully:  1.  Shower with CHG Soap the night before surgery and the  morning of Surgery.  2.  If you choose to wash your hair, wash your hair first as usual with your  normal  shampoo.  3.  After you shampoo, rinse your hair and body thoroughly to remove the  shampoo.                           4.  Use CHG as you would any other liquid soap.  You can apply chg directly  to the skin and wash                       Gently with a scrungie or  clean washcloth.  5.  Apply the CHG Soap to your body ONLY FROM THE NECK DOWN.   Do not use on face/ open                           Wound or open sores. Avoid contact with eyes, ears mouth and genitals (private parts).                       Wash face,  Genitals (private parts) with your normal soap.             6.  Wash thoroughly, paying special attention to the area where your surgery  will be performed.  7.  Thoroughly rinse your body with warm water from the neck down.  8.  DO NOT shower/wash with your normal soap after using and rinsing off  the CHG Soap.                9.  Pat yourself dry with a clean towel.            10.  Wear clean pajamas.            11.  Place clean sheets on your bed the night of your first shower and do not  sleep with pets. Day of Surgery : Do not apply any lotions/deodorants the morning of surgery.  Please wear clean clothes to the hospital/surgery center.  FAILURE TO FOLLOW THESE INSTRUCTIONS MAY RESULT IN THE CANCELLATION OF YOUR SURGERY PATIENT SIGNATURE_________________________________  NURSE SIGNATURE__________________________________  ________________________________________________________________________

## 2018-07-06 ENCOUNTER — Encounter (HOSPITAL_COMMUNITY)
Admission: RE | Admit: 2018-07-06 | Discharge: 2018-07-06 | Disposition: A | Discharge status: Home / Self Care | Payer: 59 | Source: Ambulatory Visit | Attending: General Surgery | Admitting: General Surgery

## 2018-07-06 ENCOUNTER — Other Ambulatory Visit: Payer: Self-pay

## 2018-07-06 ENCOUNTER — Encounter (HOSPITAL_COMMUNITY): Payer: Self-pay

## 2018-07-06 DIAGNOSIS — I10 Essential (primary) hypertension: Secondary | ICD-10-CM | POA: Diagnosis not present

## 2018-07-06 DIAGNOSIS — F419 Anxiety disorder, unspecified: Secondary | ICD-10-CM | POA: Diagnosis not present

## 2018-07-06 DIAGNOSIS — K219 Gastro-esophageal reflux disease without esophagitis: Secondary | ICD-10-CM | POA: Diagnosis not present

## 2018-07-06 DIAGNOSIS — K8010 Calculus of gallbladder with chronic cholecystitis without obstruction: Secondary | ICD-10-CM | POA: Diagnosis not present

## 2018-07-06 DIAGNOSIS — Z79899 Other long term (current) drug therapy: Secondary | ICD-10-CM | POA: Diagnosis not present

## 2018-07-06 LAB — CBC
HCT: 32.7 % — ABNORMAL LOW (ref 36.0–46.0)
Hemoglobin: 11.3 g/dL — ABNORMAL LOW (ref 12.0–15.0)
MCH: 33.3 pg (ref 26.0–34.0)
MCHC: 34.6 g/dL (ref 30.0–36.0)
MCV: 96.5 fL (ref 78.0–100.0)
Platelets: 112 10*3/uL — ABNORMAL LOW (ref 150–400)
RBC: 3.39 MIL/uL — ABNORMAL LOW (ref 3.87–5.11)
RDW: 13.8 % (ref 11.5–15.5)
WBC: 5.2 10*3/uL (ref 4.0–10.5)

## 2018-07-06 LAB — BASIC METABOLIC PANEL
Anion gap: 8 (ref 5–15)
BUN: 14 mg/dL (ref 6–20)
CALCIUM: 9 mg/dL (ref 8.9–10.3)
CO2: 25 mmol/L (ref 22–32)
Chloride: 107 mmol/L (ref 98–111)
Creatinine, Ser: 0.71 mg/dL (ref 0.44–1.00)
GFR calc Af Amer: >60 mL/min (ref >60)
Glucose, Bld: 99 mg/dL (ref 70–99)
POTASSIUM: 3.6 mmol/L (ref 3.5–5.1)
SODIUM: 140 mmol/L (ref 135–145)

## 2018-07-06 LAB — PREGNANCY, URINE: Preg Test, Ur: NEGATIVE

## 2018-07-06 NOTE — Anesthesia Preprocedure Evaluation (Signed)
Anesthesia Evaluation  Patient identified by MRN, date of birth, ID band Patient awake    Reviewed: Allergy & Precautions, NPO status , Patient's Chart, lab work & pertinent test results  Airway Mallampati: II  TM Distance: >3 FB Neck ROM: Full    Dental  (+) Dental Advisory Given   Pulmonary neg pulmonary ROS,    Pulmonary exam normal breath sounds clear to auscultation       Cardiovascular hypertension, Pt. on medications Normal cardiovascular exam Rhythm:Regular Rate:Normal     Neuro/Psych  Headaches, PSYCHIATRIC DISORDERS Anxiety Depression    GI/Hepatic negative GI ROS, Neg liver ROS,   Endo/Other  negative endocrine ROS  Renal/GU negative Renal ROS     Musculoskeletal negative musculoskeletal ROS (+)   Abdominal (+) + obese,   Peds  Hematology negative hematology ROS (+)   Anesthesia Other Findings   Reproductive/Obstetrics negative OB ROS                             Anesthesia Physical Anesthesia Plan  ASA: II  Anesthesia Plan: General   Post-op Pain Management:    Induction: Intravenous  PONV Risk Score and Plan: 4 or greater and Ondansetron, Dexamethasone, Midazolam, Scopolamine patch - Pre-op and Treatment may vary due to age or medical condition  Airway Management Planned: Oral ETT  Additional Equipment: None  Intra-op Plan:   Post-operative Plan: Extubation in OR  Informed Consent: I have reviewed the patients History and Physical, chart, labs and discussed the procedure including the risks, benefits and alternatives for the proposed anesthesia with the patient or authorized representative who has indicated his/her understanding and acceptance.   Dental advisory given  Plan Discussed with: CRNA  Anesthesia Plan Comments:         Anesthesia Quick Evaluation

## 2018-07-06 NOTE — Progress Notes (Signed)
07-06-18 CBC result routed to Dr. Billey Changoth's office. Also contacted Triage nurse to report platelet value, which was <130.

## 2018-07-07 ENCOUNTER — Ambulatory Visit (HOSPITAL_COMMUNITY): Payer: 59 | Admitting: Anesthesiology

## 2018-07-07 ENCOUNTER — Ambulatory Visit (HOSPITAL_COMMUNITY): Payer: 59

## 2018-07-07 ENCOUNTER — Ambulatory Visit (HOSPITAL_COMMUNITY)
Admission: RE | Admit: 2018-07-07 | Discharge: 2018-07-07 | Disposition: A | Discharge status: Home / Self Care | Payer: 59 | Source: Ambulatory Visit | Attending: General Surgery | Admitting: General Surgery

## 2018-07-07 ENCOUNTER — Encounter (HOSPITAL_COMMUNITY)
Admission: RE | Disposition: A | Discharge status: Home / Self Care | Payer: Self-pay | Source: Ambulatory Visit | Attending: General Surgery

## 2018-07-07 ENCOUNTER — Encounter (HOSPITAL_COMMUNITY): Payer: Self-pay

## 2018-07-07 DIAGNOSIS — K8010 Calculus of gallbladder with chronic cholecystitis without obstruction: Secondary | ICD-10-CM | POA: Diagnosis not present

## 2018-07-07 DIAGNOSIS — K219 Gastro-esophageal reflux disease without esophagitis: Secondary | ICD-10-CM | POA: Insufficient documentation

## 2018-07-07 DIAGNOSIS — F419 Anxiety disorder, unspecified: Secondary | ICD-10-CM | POA: Insufficient documentation

## 2018-07-07 DIAGNOSIS — K8020 Calculus of gallbladder without cholecystitis without obstruction: Secondary | ICD-10-CM

## 2018-07-07 DIAGNOSIS — Z79899 Other long term (current) drug therapy: Secondary | ICD-10-CM | POA: Insufficient documentation

## 2018-07-07 DIAGNOSIS — I10 Essential (primary) hypertension: Secondary | ICD-10-CM | POA: Insufficient documentation

## 2018-07-07 HISTORY — PX: CHOLECYSTECTOMY: SHX55

## 2018-07-07 SURGERY — LAPAROSCOPIC CHOLECYSTECTOMY WITH INTRAOPERATIVE CHOLANGIOGRAM
Anesthesia: General | Site: Abdomen

## 2018-07-07 MED ORDER — LACTATED RINGERS IV SOLN
INTRAVENOUS | Status: DC
  Administered 2018-07-07: 1000 mL via INTRAVENOUS

## 2018-07-07 MED ORDER — MEPERIDINE HCL 50 MG/ML IJ SOLN: 6.2500 mg | INTRAMUSCULAR | Status: DC | PRN

## 2018-07-07 MED ORDER — BUPIVACAINE-EPINEPHRINE 0.25% -1:200000 IJ SOLN
INTRAMUSCULAR | Status: DC | PRN
  Administered 2018-07-07: 30 mL

## 2018-07-07 MED ORDER — PROMETHAZINE HCL 25 MG/ML IJ SOLN: 6.2500 mg | INTRAMUSCULAR | Status: DC | PRN

## 2018-07-07 MED ORDER — CHLORHEXIDINE GLUCONATE CLOTH 2 % EX PADS: 6.0000 | MEDICATED_PAD | Freq: Once | CUTANEOUS | Status: DC

## 2018-07-07 MED ORDER — ONDANSETRON HCL 4 MG/2ML IJ SOLN
INTRAMUSCULAR | Status: DC | PRN
  Administered 2018-07-07: 4 mg via INTRAVENOUS

## 2018-07-07 MED ORDER — CEFAZOLIN SODIUM-DEXTROSE 2-4 GM/100ML-% IV SOLN
2.0000 g | INTRAVENOUS | Status: AC
  Administered 2018-07-07: 2 g via INTRAVENOUS
  Filled 2018-07-07: qty 100

## 2018-07-07 MED ORDER — LIDOCAINE 2% (20 MG/ML) 5 ML SYRINGE
INTRAMUSCULAR | Status: DC | PRN
  Administered 2018-07-07: 100 mg via INTRAVENOUS

## 2018-07-07 MED ORDER — CELECOXIB 200 MG PO CAPS
200.0000 mg | ORAL_CAPSULE | ORAL | Status: AC
  Administered 2018-07-07: 200 mg via ORAL
  Filled 2018-07-07: qty 1

## 2018-07-07 MED ORDER — SUGAMMADEX SODIUM 200 MG/2ML IV SOLN
INTRAVENOUS | Status: DC | PRN
  Administered 2018-07-07: 200 mg via INTRAVENOUS

## 2018-07-07 MED ORDER — FENTANYL CITRATE (PF) 250 MCG/5ML IJ SOLN
INTRAMUSCULAR | Status: DC | PRN
  Administered 2018-07-07 (×2): 100 ug via INTRAVENOUS

## 2018-07-07 MED ORDER — BUPIVACAINE-EPINEPHRINE (PF) 0.25% -1:200000 IJ SOLN
INTRAMUSCULAR | Status: AC
  Filled 2018-07-07: qty 30

## 2018-07-07 MED ORDER — DEXAMETHASONE SODIUM PHOSPHATE 10 MG/ML IJ SOLN
INTRAMUSCULAR | Status: AC
  Filled 2018-07-07: qty 1

## 2018-07-07 MED ORDER — IOPAMIDOL (ISOVUE-300) INJECTION 61%
INTRAVENOUS | Status: AC
  Filled 2018-07-07: qty 50

## 2018-07-07 MED ORDER — DEXAMETHASONE SODIUM PHOSPHATE 10 MG/ML IJ SOLN
INTRAMUSCULAR | Status: DC | PRN
  Administered 2018-07-07: 10 mg via INTRAVENOUS

## 2018-07-07 MED ORDER — FENTANYL CITRATE (PF) 100 MCG/2ML IJ SOLN
INTRAMUSCULAR | Status: AC
  Filled 2018-07-07: qty 2

## 2018-07-07 MED ORDER — FENTANYL CITRATE (PF) 100 MCG/2ML IJ SOLN
25.0000 ug | INTRAMUSCULAR | Status: DC | PRN
  Administered 2018-07-07: 50 ug via INTRAVENOUS

## 2018-07-07 MED ORDER — KETAMINE HCL 10 MG/ML IJ SOLN
INTRAMUSCULAR | Status: AC
  Filled 2018-07-07: qty 1

## 2018-07-07 MED ORDER — LACTATED RINGERS IV SOLN
INTRAVENOUS | Status: AC | PRN
  Administered 2018-07-07: 1000 mL

## 2018-07-07 MED ORDER — ACETAMINOPHEN 500 MG PO TABS
1000.0000 mg | ORAL_TABLET | ORAL | Status: AC
  Administered 2018-07-07: 1000 mg via ORAL
  Filled 2018-07-07: qty 2

## 2018-07-07 MED ORDER — KETAMINE HCL 10 MG/ML IJ SOLN
INTRAMUSCULAR | Status: DC | PRN
  Administered 2018-07-07: 70 mg via INTRAVENOUS

## 2018-07-07 MED ORDER — SCOPOLAMINE 1 MG/3DAYS TD PT72
MEDICATED_PATCH | TRANSDERMAL | Status: AC
  Filled 2018-07-07: qty 1

## 2018-07-07 MED ORDER — LIDOCAINE 2% (20 MG/ML) 5 ML SYRINGE
INTRAMUSCULAR | Status: AC
  Filled 2018-07-07: qty 5

## 2018-07-07 MED ORDER — ROCURONIUM BROMIDE 10 MG/ML (PF) SYRINGE
PREFILLED_SYRINGE | INTRAVENOUS | Status: DC | PRN
  Administered 2018-07-07: 50 mg via INTRAVENOUS
  Administered 2018-07-07: 10 mg via INTRAVENOUS

## 2018-07-07 MED ORDER — PROPOFOL 10 MG/ML IV BOLUS
INTRAVENOUS | Status: AC
  Filled 2018-07-07: qty 40

## 2018-07-07 MED ORDER — ROCURONIUM BROMIDE 10 MG/ML (PF) SYRINGE
PREFILLED_SYRINGE | INTRAVENOUS | Status: AC
  Filled 2018-07-07: qty 10

## 2018-07-07 MED ORDER — MIDAZOLAM HCL 2 MG/2ML IJ SOLN
INTRAMUSCULAR | Status: DC | PRN
  Administered 2018-07-07: 2 mg via INTRAVENOUS

## 2018-07-07 MED ORDER — FENTANYL CITRATE (PF) 250 MCG/5ML IJ SOLN
INTRAMUSCULAR | Status: AC
  Filled 2018-07-07: qty 5

## 2018-07-07 MED ORDER — ONDANSETRON HCL 4 MG/2ML IJ SOLN
INTRAMUSCULAR | Status: AC
  Filled 2018-07-07: qty 2

## 2018-07-07 MED ORDER — SUGAMMADEX SODIUM 200 MG/2ML IV SOLN
INTRAVENOUS | Status: AC
  Filled 2018-07-07: qty 2

## 2018-07-07 MED ORDER — HYDROCODONE-ACETAMINOPHEN 5-325 MG PO TABS: 1.0000 | ORAL_TABLET | Freq: Four times a day (QID) | ORAL | 0 refills | Status: DC | PRN

## 2018-07-07 MED ORDER — GABAPENTIN 300 MG PO CAPS
300.0000 mg | ORAL_CAPSULE | ORAL | Status: AC
  Administered 2018-07-07: 300 mg via ORAL
  Filled 2018-07-07: qty 1

## 2018-07-07 MED ORDER — PHENYLEPHRINE 40 MCG/ML (10ML) SYRINGE FOR IV PUSH (FOR BLOOD PRESSURE SUPPORT)
PREFILLED_SYRINGE | INTRAVENOUS | Status: AC
  Filled 2018-07-07: qty 10

## 2018-07-07 MED ORDER — MIDAZOLAM HCL 2 MG/2ML IJ SOLN
INTRAMUSCULAR | Status: AC
  Filled 2018-07-07: qty 2

## 2018-07-07 MED ORDER — PROPOFOL 10 MG/ML IV BOLUS
INTRAVENOUS | Status: DC | PRN
  Administered 2018-07-07: 50 mg via INTRAVENOUS
  Administered 2018-07-07: 100 mg via INTRAVENOUS
  Administered 2018-07-07: 150 mg via INTRAVENOUS

## 2018-07-07 SURGICAL SUPPLY — 27 items
APPLIER CLIP 5 13 M/L LIGAMAX5 (MISCELLANEOUS) ×2
CABLE HIGH FREQUENCY MONO STRZ (ELECTRODE) ×2 IMPLANT
CATH REDDICK CHOLANGI 4FR 50CM (CATHETERS) ×2 IMPLANT
CHLORAPREP W/TINT 26ML (MISCELLANEOUS) ×2 IMPLANT
CLIP APPLIE 5 13 M/L LIGAMAX5 (MISCELLANEOUS) ×1 IMPLANT
COVER MAYO STAND STRL (DRAPES) ×2 IMPLANT
DECANTER SPIKE VIAL GLASS SM (MISCELLANEOUS) ×2 IMPLANT
DERMABOND ADVANCED (GAUZE/BANDAGES/DRESSINGS) ×1
DERMABOND ADVANCED .7 DNX12 (GAUZE/BANDAGES/DRESSINGS) ×1 IMPLANT
DRAPE C-ARM 42X120 X-RAY (DRAPES) ×2 IMPLANT
ELECT REM PT RETURN 15FT ADLT (MISCELLANEOUS) ×2 IMPLANT
GLOVE BIO SURGEON STRL SZ7.5 (GLOVE) ×2 IMPLANT
GOWN STRL REUS W/TWL XL LVL3 (GOWN DISPOSABLE) ×6 IMPLANT
HEMOSTAT SURGICEL 4X8 (HEMOSTASIS) IMPLANT
IV CATH 14GX2 1/4 (CATHETERS) ×2 IMPLANT
KIT BASIN OR (CUSTOM PROCEDURE TRAY) ×2 IMPLANT
POUCH SPECIMEN RETRIEVAL 10MM (ENDOMECHANICALS) ×2 IMPLANT
SCISSORS LAP 5X35 DISP (ENDOMECHANICALS) ×2 IMPLANT
SET IRRIG TUBING LAPAROSCOPIC (IRRIGATION / IRRIGATOR) ×2 IMPLANT
SLEEVE XCEL OPT CAN 5 100 (ENDOMECHANICALS) ×4 IMPLANT
SUT MNCRL AB 4-0 PS2 18 (SUTURE) ×2 IMPLANT
TOWEL OR 17X26 10 PK STRL BLUE (TOWEL DISPOSABLE) ×2 IMPLANT
TOWEL OR NON WOVEN STRL DISP B (DISPOSABLE) ×2 IMPLANT
TRAY LAPAROSCOPIC (CUSTOM PROCEDURE TRAY) ×2 IMPLANT
TROCAR BLADELESS OPT 5 100 (ENDOMECHANICALS) ×2 IMPLANT
TROCAR XCEL BLUNT TIP 100MML (ENDOMECHANICALS) ×2 IMPLANT
TUBING INSUF HEATED (TUBING) ×2 IMPLANT

## 2018-07-07 NOTE — Op Note (Signed)
07/07/2018  8:48 AM  PATIENT:  Julie Kennedy  32 y.o. female  PRE-OPERATIVE DIAGNOSIS:  gallstones  POST-OPERATIVE DIAGNOSIS:  gallstones  PROCEDURE:  Procedure(s): LAPAROSCOPIC CHOLECYSTECTOMY WITH INTRAOPERATIVE CHOLANGIOGRAM ERAS PATHWAY (N/A)  SURGEON:  Surgeon(s) and Role:    * Griselda Mineroth, Jeremiah Curci III, MD - Primary  PHYSICIAN ASSISTANT:   ASSISTANTS: none   ANESTHESIA:   local and general  EBL:  10 mL   BLOOD ADMINISTERED:none  DRAINS: none   LOCAL MEDICATIONS USED:  MARCAINE     SPECIMEN:  Source of Specimen:  gallbladder  DISPOSITION OF SPECIMEN:  PATHOLOGY  COUNTS:  YES  TOURNIQUET:  * No tourniquets in log *  DICTATION: .Dragon Dictation     Procedure: After informed consent was obtained the patient was brought to the operating room and placed in the supine position on the operating room table. After adequate induction of general anesthesia the patient's abdomen was prepped with ChloraPrep allowed to dry and draped in usual sterile manner. An appropriate timeout was performed. The area below the umbilicus was infiltrated with quarter percent  Marcaine. A small incision was made with a 15 blade knife. The incision was carried down through the subcutaneous tissue bluntly with a hemostat and [employer] retractors. The linea alba was identified. The linea alba was incised with a 15 blade knife and each side was grasped with Coker clamps. The preperitoneal space was then probed with a hemostat until the peritoneum was opened and access was gained to the abdominal cavity. A 0 Vicryl pursestring stitch was placed in the fascia surrounding the opening. A Hassan cannula was then placed through the opening and anchored in place with the previously placed Vicryl purse string stitch. The abdomen was insufflated with carbon dioxide without difficulty. A laparoscope was [facility] the [facility] in the right upper quadrant was inspected. Next the epigastric region was infiltrated  with % Marcaine. A small incision was made with a 15 blade knife. A 5 mm port was placed bluntly through this incision into the abdominal cavity under direct vision. Next 2 sites were chosen laterally on the right side of the abdomen for placement of 5 mm ports. Each of these areas was infiltrated with quarter percent Marcaine. Small stab incisions were made with a 15 blade knife. 5 mm ports were then placed bluntly through these incisions into the abdominal cavity under direct vision without difficulty. A blunt grasper was placed through the lateralmost 5 mm port and used to grasp the dome of the gallbladder and elevated anteriorly and superiorly. Another blunt grasper was placed through the other 5 mm port and used to retract the body and neck of the gallbladder. A dissector was placed through the epigastric port and using the electrocautery the peritoneal reflection at the gallbladder neck was opened. Blunt dissection was then carried out in this area until the gallbladder neck-cystic duct junction was readily identified and a good critical view window was created. A single clip was placed on the gallbladder neck. A small  ductotomy was made just below the clip with laparoscopic scissors. A 14-gauge Angiocath was then placed through the anterior abdominal wall under direct vision. A Reddick cholangiogram catheter was then placed through the Angiocath and flushed. The catheter was then placed in the cystic duct and anchored in place with a clip. A cholangiogram was obtained that showed no filling defects good emptying into the duodenum an adequate length on the cystic duct. The anchoring clip and catheters were then removed from  the patient. 3 clips were placed proximally on the cystic duct and the duct was divided between the 2 sets of clips. Posterior to this the cystic artery was identified and again dissected bluntly in a circumferential manner until a good window  was created. 2 clips were placed proximally  and one distally on the artery and the artery was divided between the 2 sets of clips. Next a laparoscopic hook cautery device was used to separate the gallbladder from the liver bed. Prior to completely detaching the gallbladder from the liver bed the liver bed was inspected and several small bleeding points were coagulated with the electrocautery until the area was completely hemostatic. The gallbladder was then detached the rest of it from the liver bed without difficulty. A laparoscopic bag was inserted through the hassan port. The laparoscope was moved to the epigastric port. The gallbladder was placed within the bag and the bag was sealed.  The bag with the gallbladder was then [facility] with the [facility] through the infraumbilical port without difficulty. The fascial defect was then closed with the previously placed Vicryl pursestring stitch as well as with another figure-of-eight 0 Vicryl stitch. The liver bed was inspected again and found to be hemostatic. The abdomen was irrigated with copious amounts of saline until the effluent was clear. The ports were then removed under direct vision without difficulty and were found to be hemostatic. The gas was allowed to escape. The skin incisions were all closed with interrupted 4-0 Monocryl subcuticular stitches. Dermabond dressings were applied. The patient tolerated the procedure well. At the end of the case all needle sponge and instrument counts were correct. The patient was then awakened and taken to recovery in stable condition  PLAN OF CARE: Discharge to home after PACU  PATIENT DISPOSITION:  PACU - hemodynamically stable.   Delay start of Pharmacological VTE agent (>24hrs) due to surgical blood loss or risk of bleeding: not applicable

## 2018-07-07 NOTE — Transfer of Care (Signed)
Immediate Anesthesia Transfer of Care Note  Patient: Julie Kennedy  Procedure(s) Performed: LAPAROSCOPIC CHOLECYSTECTOMY WITH INTRAOPERATIVE CHOLANGIOGRAM ERAS PATHWAY (N/A Abdomen)  Patient Location: PACU  Anesthesia Type:General  Level of Consciousness: awake, alert , oriented and patient cooperative  Airway & Oxygen Therapy: Patient Spontanous Breathing and Patient connected to face mask oxygen  Post-op Assessment: Report given to RN, Post -op Vital signs reviewed and stable and Patient moving all extremities X 4  Post vital signs: stable  Last Vitals:  Vitals Value Taken Time  BP 131/87 07/07/2018  8:58 AM  Temp    Pulse 96 07/07/2018  8:59 AM  Resp 22 07/07/2018  8:59 AM  SpO2 100 % 07/07/2018  8:59 AM  Vitals shown include unvalidated device data.  Last Pain:  Vitals:   07/07/18 0636  TempSrc:   PainSc: 0-No pain      Patients Stated Pain Goal: 4 (07/07/18 0636)  Complications: No apparent anesthesia complications

## 2018-07-07 NOTE — H&P (Signed)
Julie Kennedy  [facility] Patient #: [id number] DOB: Nov 22, 1986 Single / Language: [place] / Race: White Female   History of Present Illness The patient is a 32 year old female who presents with abdominal pain. We're asked to see the patient in consultation by Dr. Arva ChafeNicholas Wendling to evaluate her for gallstones. The patient is a 32 year old white female who has been experiencing right upper quadrant pain for several weeks now. The pain is described as constant and severe. The pain is been associated with significant nausea. She underwent an ultrasound that showed stones in the gallbladder but no gallbladder wall thickening or ductal dilatation. Her liver functions were normal. Her white count was slightly elevated.   Past Surgical History  Cesarean Section - 1  Shoulder Surgery  Right. Tonsillectomy   Diagnostic Studies History Colonoscopy  never Mammogram  1-3 years ago Pap Smear  1-5 years ago  Allergies  No Known Drug Allergies  Allergies Reconciled   Medication History  Ibuprofen (Oral) Specific strength unknown - Active. Multi Vitamin (Oral) Active. Amlodipine Besy-Benazepril HCl (5-10MG  Capsule, Oral) Active. Atarax (25MG  Tablet, Oral) Active. Cetirizine HCl (Oral) Specific strength unknown - Active. OxyCODONE HCl (Oral) Specific strength unknown - Active. Medications Reconciled  Social History Alcohol use  Occasional alcohol use. No caffeine use  No drug use  Tobacco use  Never smoker.  Family History  Arthritis  Mother. Hypertension  Mother.  Pregnancy / Birth History Age at menarche  15 years. Contraceptive History  Oral contraceptives. Gravida  2 Irregular periods  Length (months) of breastfeeding  3-6 Maternal age  30-25 Para  1  Other Problems  Anxiety Disorder  Chest pain  Cholelithiasis  Gastroesophageal Reflux Disease  High blood pressure     Review of Systems  General Not  Present- Appetite Loss, Chills, Fatigue, Fever, Night Sweats, Weight Gain and Weight Loss. Note: All other systems negative (unless as noted in HPI & included Review of Systems) Skin Not Present- Change in Wart/Mole, Dryness, Hives, Jaundice, New Lesions, Non-Healing Wounds, Rash and Ulcer. HEENT Not Present- Earache, Hearing Loss, Hoarseness, Nose Bleed, Oral Ulcers, Ringing in the Ears, Seasonal Allergies, Sinus Pain, Sore Throat, Visual Disturbances, Wears glasses/contact lenses and Yellow Eyes. Respiratory Not Present- Bloody sputum, Chronic Cough, Difficulty Breathing, Snoring and Wheezing. Breast Not Present- Breast Mass, Breast Pain, Nipple Discharge and Skin Changes. Cardiovascular Not Present- Chest Pain, Difficulty Breathing Lying Down, Leg Cramps, Palpitations, Rapid Heart Rate, Shortness of Breath and Swelling of Extremities. Gastrointestinal Not Present- Abdominal Pain, Bloating, Bloody Stool, Change in Bowel Habits, Chronic diarrhea, Constipation, Difficulty Swallowing, Excessive gas, Gets full quickly at meals, Hemorrhoids, Indigestion, Nausea, Rectal Pain and Vomiting. Female Genitourinary Not Present- Frequency, Nocturia, Painful Urination, Pelvic Pain and Urgency. Musculoskeletal Not Present- Back Pain, Joint Pain, Joint Stiffness, Muscle Pain, Muscle Weakness and Swelling of Extremities. Neurological Not Present- Decreased Memory, Fainting, Headaches, Numbness, Seizures, Tingling, Tremor, Trouble walking and Weakness. Psychiatric Not Present- Anxiety, Bipolar, Change in Sleep Pattern, Depression, Fearful and Frequent crying. Endocrine Not Present- Cold Intolerance, Excessive Hunger, Hair Changes, Heat Intolerance, Hot flashes and New Diabetes. Hematology Not Present- Easy Bruising, Excessive bleeding, Gland problems, HIV and Persistent Infections.  Vitals  Weight: 221.8 lb Height: 68in Height was reported by patient. Body Surface Area: 2.14 m Body Mass Index: 33.72 kg/m   Pain Level: 6/10 Temp.: 97.74F(Temporal)  Pulse: 111 (Regular)  BP: 138/98 (Sitting, Left Arm, Standard)       Physical Exam  General Mental Status-Alert. General  Appearance-Consistent with stated age. Hydration-Well hydrated. Voice-Normal.  Head and Neck Head-normocephalic, atraumatic with no lesions or palpable masses. Trachea-midline. Thyroid Gland Characteristics - normal size and consistency.  Eye Eyeball - Bilateral-Extraocular movements intact. Sclera/Conjunctiva - Bilateral-No scleral icterus.  Chest and Lung Exam Chest and lung exam reveals -quiet, even and easy respiratory effort with no use of accessory muscles and on auscultation, normal breath sounds, no adventitious sounds and normal vocal resonance. Inspection Chest Wall - Normal. Back - normal.  Cardiovascular Cardiovascular examination reveals -normal heart sounds, regular rate and rhythm with no murmurs and normal pedal pulses bilaterally.  Abdomen Note: The abdomen is soft. There is moderate tenderness to palpation on the right upper and lower abdomen. There is no palpable mass.   Neurologic Neurologic evaluation reveals -alert and oriented x 3 with no impairment of recent or remote memory. Mental Status-Normal.  Musculoskeletal Normal Exam - Left-Upper Extremity Strength Normal and Lower Extremity Strength Normal. Normal Exam - Right-Upper Extremity Strength Normal and Lower Extremity Strength Normal.  Lymphatic Head & Neck  General Head & Neck Lymphatics: Bilateral - Description - Normal. Axillary  General Axillary Region: Bilateral - Description - Normal. Tenderness - Non Tender. Femoral & Inguinal  Generalized Femoral & Inguinal Lymphatics: Bilateral - Description - Normal. Tenderness - Non Tender.    Assessment & Plan  GALLSTONES (K80.20) Impression: The patient appears to have symptomatic gallstones. Because of the risk of further painful  episodes and possible pancreatitis and think she would benefit from having her gallbladder removed. She would also like to have this done. I have discussed with her in detail the risks and benefits of the operation as well as some of the technical aspects and she understands and wishes to proceed. Current Plans Pt Education - Gallstones: discussed with patient and provided information.

## 2018-07-07 NOTE — Anesthesia Procedure Notes (Signed)
Procedure Name: Intubation Date/Time: 07/07/2018 7:38 AM Performed by: Donna Bernardrimble, Dickey Caamano H, CRNA Pre-anesthesia Checklist: Patient identified, Emergency Drugs available, Suction available, Patient being monitored and Timeout performed Patient Re-evaluated:Patient Re-evaluated prior to induction Oxygen Delivery Method: Circle system utilized Preoxygenation: Pre-oxygenation with 100% oxygen Induction Type: IV induction Ventilation: Mask ventilation without difficulty Laryngoscope Size: Miller and 2 Grade View: Grade III Tube type: Oral Tube size: 7.0 mm Number of attempts: 1 Airway Equipment and Method: Stylet Placement Confirmation: positive ETCO2,  ETT inserted through vocal cords under direct vision,  CO2 detector and breath sounds checked- equal and bilateral Secured at: 21 cm Tube secured with: Tape Dental Injury: Teeth and Oropharynx as per pre-operative assessment  Difficulty Due To: Difficult Airway- due to anterior larynx, Difficult Airway- due to reduced neck mobility and Difficult Airway- due to limited oral opening Comments: I was able to intubate on first attempt w Hyacinth MeekerMiller 2.  Grade 3 view w limited oral opening and limited neck extension.  While not terribly difficult, I think a Glidescope would have made it easier.

## 2018-07-07 NOTE — Anesthesia Postprocedure Evaluation (Signed)
Anesthesia Post Note  Patient: Julie Kennedy  Procedure(s) Performed: LAPAROSCOPIC CHOLECYSTECTOMY WITH INTRAOPERATIVE CHOLANGIOGRAM ERAS PATHWAY (N/A Abdomen)     Patient location during evaluation: PACU Anesthesia Type: General Level of consciousness: sedated and patient cooperative Pain management: pain level controlled Vital Signs Assessment: post-procedure vital signs reviewed and stable Respiratory status: spontaneous breathing Cardiovascular status: stable Anesthetic complications: no    Last Vitals:  Vitals:   07/07/18 0945 07/07/18 1000  BP: 124/88 (!) 126/92  Pulse: 81 83  Resp: 11 13  Temp:  36.8 C  SpO2: 99% 96%    Last Pain:  Vitals:   07/07/18 1000  TempSrc:   PainSc: 3                  Lewie LoronJohn Jagger Beahm

## 2018-07-07 NOTE — Interval H&P Note (Signed)
History and Physical Interval Note:  07/07/2018 7:13 AM  Julie Kennedy  has presented today for surgery, with the diagnosis of gallstones  The various methods of treatment have been discussed with the patient and family. After consideration of risks, benefits and other options for treatment, the patient has consented to  Procedure(s): LAPAROSCOPIC CHOLECYSTECTOMY WITH INTRAOPERATIVE CHOLANGIOGRAM ERAS PATHWAY (N/A) as a surgical intervention .  The patient's history has been reviewed, patient examined, no change in status, stable for surgery.  I have reviewed the patient's chart and labs.  Questions were answered to the patient's satisfaction.     TOTH III,Jacobo Moncrief S

## 2018-07-08 ENCOUNTER — Encounter (HOSPITAL_COMMUNITY): Payer: Self-pay | Admitting: General Surgery

## 2018-07-23 ENCOUNTER — Emergency Department (HOSPITAL_BASED_OUTPATIENT_CLINIC_OR_DEPARTMENT_OTHER): Payer: 59

## 2018-07-23 ENCOUNTER — Other Ambulatory Visit: Payer: Self-pay

## 2018-07-23 ENCOUNTER — Emergency Department (HOSPITAL_BASED_OUTPATIENT_CLINIC_OR_DEPARTMENT_OTHER)
Admission: EM | Admit: 2018-07-23 | Discharge: 2018-07-23 | Discharge status: Against Medical Advice | Payer: 59 | Attending: Emergency Medicine | Admitting: Emergency Medicine

## 2018-07-23 DIAGNOSIS — R197 Diarrhea, unspecified: Secondary | ICD-10-CM | POA: Diagnosis not present

## 2018-07-23 DIAGNOSIS — R109 Unspecified abdominal pain: Secondary | ICD-10-CM | POA: Insufficient documentation

## 2018-07-23 DIAGNOSIS — Z5321 Procedure and treatment not carried out due to patient leaving prior to being seen by health care provider: Secondary | ICD-10-CM | POA: Diagnosis not present

## 2018-07-23 DIAGNOSIS — F1010 Alcohol abuse, uncomplicated: Secondary | ICD-10-CM | POA: Diagnosis not present

## 2018-07-23 DIAGNOSIS — I10 Essential (primary) hypertension: Secondary | ICD-10-CM | POA: Diagnosis not present

## 2018-07-23 DIAGNOSIS — E860 Dehydration: Secondary | ICD-10-CM

## 2018-07-23 DIAGNOSIS — R112 Nausea with vomiting, unspecified: Secondary | ICD-10-CM | POA: Diagnosis not present

## 2018-07-23 DIAGNOSIS — Z79899 Other long term (current) drug therapy: Secondary | ICD-10-CM | POA: Diagnosis not present

## 2018-07-23 LAB — CBC WITH DIFFERENTIAL/PLATELET
BASOS ABS: 0 10*3/uL (ref 0.0–0.1)
Basophils Relative: 0 %
EOS ABS: 0 10*3/uL (ref 0.0–0.7)
Eosinophils Relative: 0 %
HEMATOCRIT: 37.1 % (ref 36.0–46.0)
Hemoglobin: 12.8 g/dL (ref 12.0–15.0)
LYMPHS ABS: 1.8 10*3/uL (ref 0.7–4.0)
Lymphocytes Relative: 16 %
MCH: 32.8 pg (ref 26.0–34.0)
MCHC: 34.5 g/dL (ref 30.0–36.0)
MCV: 95.1 fL (ref 78.0–100.0)
MONO ABS: 0.5 10*3/uL (ref 0.1–1.0)
Monocytes Relative: 4 %
NEUTROS ABS: 9 10*3/uL — AB (ref 1.7–7.7)
Neutrophils Relative %: 80 %
PLATELETS: 82 10*3/uL — AB (ref 150–400)
RBC: 3.9 MIL/uL (ref 3.87–5.11)
RDW: 13.9 % (ref 11.5–15.5)
WBC: 11.3 10*3/uL — AB (ref 4.0–10.5)

## 2018-07-23 LAB — BASIC METABOLIC PANEL
ANION GAP: 19 — AB (ref 5–15)
BUN: 5 mg/dL — AB (ref 6–20)
CALCIUM: 8 mg/dL — AB (ref 8.9–10.3)
CO2: 19 mmol/L — ABNORMAL LOW (ref 22–32)
Chloride: 101 mmol/L (ref 98–111)
Creatinine, Ser: 0.53 mg/dL (ref 0.44–1.00)
GFR calc Af Amer: >60 mL/min (ref >60)
GFR calc non Af Amer: >60 mL/min (ref >60)
GLUCOSE: 110 mg/dL — AB (ref 70–99)
POTASSIUM: 3.4 mmol/L — AB (ref 3.5–5.1)
Sodium: 139 mmol/L (ref 135–145)

## 2018-07-23 LAB — URINALYSIS, ROUTINE W REFLEX MICROSCOPIC
BILIRUBIN URINE: NEGATIVE
Glucose, UA: NEGATIVE mg/dL
KETONES UR: 15 mg/dL — AB
Leukocytes, UA: NEGATIVE
Nitrite: NEGATIVE
Protein, ur: 100 mg/dL — AB
pH: 5.5 (ref 5.0–8.0)

## 2018-07-23 LAB — COMPREHENSIVE METABOLIC PANEL
ALBUMIN: 3.9 g/dL (ref 3.5–5.0)
ALK PHOS: 59 U/L (ref 38–126)
ALT: 20 U/L (ref 0–44)
AST: 46 U/L — AB (ref 15–41)
Anion gap: 21 — ABNORMAL HIGH (ref 5–15)
BILIRUBIN TOTAL: 0.9 mg/dL (ref 0.3–1.2)
BUN: 6 mg/dL (ref 6–20)
CALCIUM: 7.8 mg/dL — AB (ref 8.9–10.3)
CO2: 17 mmol/L — ABNORMAL LOW (ref 22–32)
CREATININE: 0.47 mg/dL (ref 0.44–1.00)
Chloride: 100 mmol/L (ref 98–111)
GFR calc Af Amer: >60 mL/min (ref >60)
GLUCOSE: 120 mg/dL — AB (ref 70–99)
Potassium: 3.3 mmol/L — ABNORMAL LOW (ref 3.5–5.1)
SODIUM: 138 mmol/L (ref 135–145)
TOTAL PROTEIN: 6.9 g/dL (ref 6.5–8.1)

## 2018-07-23 LAB — ETHANOL: ALCOHOL ETHYL (B): 177 mg/dL — AB (ref <10)

## 2018-07-23 LAB — LIPASE, BLOOD: Lipase: 30 U/L (ref 11–51)

## 2018-07-23 LAB — PREGNANCY, URINE: PREG TEST UR: NEGATIVE

## 2018-07-23 LAB — URINALYSIS, MICROSCOPIC (REFLEX)
Bacteria, UA: NONE SEEN
RBC / HPF: >50 RBC/hpf (ref 0–5)

## 2018-07-23 MED ORDER — LORAZEPAM 2 MG/ML IJ SOLN
1.0000 mg | Freq: Once | INTRAMUSCULAR | Status: AC
  Administered 2018-07-23: 1 mg via INTRAVENOUS
  Filled 2018-07-23: qty 1

## 2018-07-23 MED ORDER — SODIUM CHLORIDE 0.9 % IV BOLUS (SEPSIS)
1000.0000 mL | Freq: Once | INTRAVENOUS | Status: AC
  Administered 2018-07-23: 1000 mL via INTRAVENOUS

## 2018-07-23 MED ORDER — LACTATED RINGERS IV BOLUS
2000.0000 mL | Freq: Once | INTRAVENOUS | Status: AC
  Administered 2018-07-23: 2000 mL via INTRAVENOUS

## 2018-07-23 MED ORDER — LORAZEPAM 2 MG/ML IJ SOLN
2.0000 mg | Freq: Once | INTRAMUSCULAR | Status: AC
  Administered 2018-07-23: 2 mg via INTRAVENOUS
  Filled 2018-07-23: qty 1

## 2018-07-23 MED ORDER — ONDANSETRON HCL 4 MG/2ML IJ SOLN
4.0000 mg | Freq: Once | INTRAMUSCULAR | Status: AC
  Administered 2018-07-23: 4 mg via INTRAVENOUS
  Filled 2018-07-23: qty 2

## 2018-07-23 NOTE — ED Notes (Signed)
u-preg to be collected

## 2018-07-23 NOTE — ED Notes (Signed)
Patient transported to X-ray 

## 2018-07-23 NOTE — ED Provider Notes (Signed)
[facility] EMERGENCY DEPARTMENT Provider Note   CSN: [id number] Arrival date & time: 07/23/18  0104     History   Chief Complaint Chief Complaint  Patient presents with  . Shortness of Breath  . Nasal Congestion    HPI Julie Kennedy is a 32 y.o. female.  The history is provided by the patient.  Emesis   This is a new problem. The current episode started more than 2 days ago. The problem has been gradually worsening. The emesis has an appearance of stomach contents. There has been no fever. Associated symptoms include abdominal pain and diarrhea. Pertinent negatives include no fever.  Patient presents for nausea/vomiting/diarrhea.  She reports feeling anxious.  She reports shortness of breath.  She reports feeling pain all over her left side.  No fevers. No active chest pain reported. No Pleuritic chest pain. Reports recent cholecystectomy on 8/16.  She reports she did well postoperatively, but then the symptoms started about 3 days ago.  She also mentions blood in her stool.  I asked her about alcohol intake, she reports she drinks occasionally, once a week.  She reports her last drink was wine several hours ago.  Past Medical History:  Diagnosis Date  . Anxiety   . Depression   . Frequent headaches   . Hard of hearing   . Hypertension     Patient Active Problem List   Diagnosis Date Noted  . Essential hypertension 04/27/2018  . GAD (generalized anxiety disorder) 04/27/2018    Past Surgical History:  Procedure Laterality Date  . CESAREAN SECTION    . CHOLECYSTECTOMY N/A 07/07/2018   Procedure: LAPAROSCOPIC CHOLECYSTECTOMY WITH INTRAOPERATIVE CHOLANGIOGRAM ERAS PATHWAY;  Surgeon: Griselda Miner, MD;  Location: [facility];  Service: General;  Laterality: N/A;  . TONSILLECTOMY AND ADENOIDECTOMY       OB History    Gravida  1   Para  1   Term  1   Preterm      AB      Living  1     SAB      TAB      Ectopic      Multiple      Live Births               Home Medications    Prior to Admission medications   Medication Sig Start Date End Date Taking? Authorizing Provider  amLODipine (NORVASC) 5 MG tablet Take 5 mg by mouth daily.    [provider]  HYDROcodone-acetaminophen (NORCO/VICODIN) 5-325 MG tablet Take 1-2 tablets by mouth every 6 (six) hours as needed. 07/07/18   Chevis Pretty III, MD  hydrOXYzine (ATARAX/VISTARIL) 25 MG tablet Take 1-3 tablets (25-75 mg total) by mouth 3 (three) times daily as needed for anxiety. 04/27/18   Sharlene Dory, DO  ibuprofen (ADVIL,MOTRIN) 200 MG tablet Take 600 mg by mouth every 6 (six) hours as needed for headache or moderate pain.     [provider]  multivitamin [facility]) per tablet Take 1 tablet by mouth daily.    [provider]  norgestimate-ethinyl estradiol (ORTHO-CYCLEN,SPRINTEC,PREVIFEM) 0.25-35 MG-MCG tablet Take 1 tablet by mouth daily.    [provider]  sertraline (ZOLOFT) 50 MG tablet Take 50 mg by mouth daily.    [provider]    Family History Family History  Problem Relation Age of Onset  . Arthritis Mother   . Depression Mother   . Hypertension Mother  Social History Social History   Tobacco Use  . Smoking status: Never Smoker  . Smokeless tobacco: Never Used  Substance Use Topics  . Alcohol use: Yes    Comment: occ  . Drug use: Not Currently    Comment: pt denies a history, but cocaine listed from previous visit      Allergies   Percocet [oxycodone-acetaminophen]   Review of Systems Review of Systems  Constitutional: Negative for fever.  Respiratory: Positive for shortness of breath.   Cardiovascular: Negative for chest pain.  Gastrointestinal: Positive for abdominal pain, diarrhea and vomiting.  All other systems reviewed and are negative.    Physical Exam Updated Vital Signs BP 129/76 (BP Location: Right Arm)   Pulse (!) 114   Temp 98.4 F (36.9 C) (Oral)   Resp (!) 25   Ht  1.727 m (5\' 8" )   Wt 97.5 kg   LMP 07/15/2018   SpO2 95%   BMI 32.68 kg/m   Physical Exam  CONSTITUTIONAL: Anxious and mildly ill-appearing HEAD: Normocephalic/atraumatic EYES: EOMI/PERRL no icterus ENMT: Mucous membranes dry NECK: supple no meningeal signs SPINE/BACK:entire spine nontender CV: Tachycardic LUNGS: Lungs are clear to auscultation bilaterally, no apparent distress ABDOMEN: soft, nontender, no rebound or guarding, bowel sounds noted throughout abdomen, no focal tenderness.  Well-healed incisions noted GU:no cva tenderness Rectal-no blood or melena, nurse present for exam NEURO: Pt is awake/alert/appropriate, moves all extremitiesx4.  No facial droop.  No arm or leg drift EXTREMITIES: pulses normal/equal, full ROM no deformities or tenderness noted SKIN: warm, color normal PSYCH: anxious ED Treatments / Results  Labs (all labs ordered are listed, but only abnormal results are displayed) Labs Reviewed  COMPREHENSIVE METABOLIC PANEL - Abnormal; Notable for the following components:      Result Value   Potassium 3.3 (*)    CO2 17 (*)    Glucose, Bld 120 (*)    Calcium 7.8 (*)    AST 46 (*)    Anion gap 21 (*)    All other components within normal limits  CBC WITH DIFFERENTIAL/PLATELET - Abnormal; Notable for the following components:   WBC 11.3 (*)    Platelets 82 (*)    Neutro Abs 9.0 (*)    All other components within normal limits  URINALYSIS, ROUTINE W REFLEX MICROSCOPIC - Abnormal; Notable for the following components:   Color, Urine RED (*)    APPearance CLOUDY (*)    Specific Gravity, Urine >1.030 (*)    Hgb urine dipstick LARGE (*)    Ketones, ur 15 (*)    Protein, ur 100 (*)    All other components within normal limits  ETHANOL - Abnormal; Notable for the following components:   Alcohol, Ethyl (B) 177 (*)    All other components within normal limits  BASIC METABOLIC PANEL - Abnormal; Notable for the following components:   Potassium 3.4 (*)     CO2 19 (*)    Glucose, Bld 110 (*)    BUN 5 (*)    Calcium 8.0 (*)    Anion gap 19 (*)    All other components within normal limits  LIPASE, BLOOD  PREGNANCY, URINE  URINALYSIS, MICROSCOPIC (REFLEX)    EKG EKG Interpretation  Date/Time:  Sunday July 23 2018 01:22:51 EDT Ventricular Rate:  118 PR Interval:    QRS Duration: 83 QT Interval:  356 QTC Calculation: 499 R Axis:   107 Text Interpretation:  Sinus tachycardia Consider left atrial enlargement Borderline right axis deviation Low  voltage, precordial leads Abnormal Q suggests anterior infarct Borderline T abnormalities, anterior leads Baseline wander in lead(s) V3 No significant change since last tracing Confirmed by Zadie Rhine ([id number]) on 07/23/2018 1:30:11 AM   Radiology Dg Chest 2 View  Result Date: 07/23/2018 CLINICAL DATA:  Patient with left body pain.  Abdominal pain. EXAM: CHEST - 2 VIEW COMPARISON:  Chest radiograph 07/01/2018 FINDINGS: Monitoring leads overlie the patient. Stable cardiac and mediastinal contours. No consolidative pulmonary opacities. No pleural effusion or pneumothorax. IMPRESSION: No acute cardiopulmonary process. Electronically Signed   By: Annia Belt M.D.   On: 07/23/2018 02:52    Procedures Procedures  CRITICAL CARE Performed by: Joya Gaskins Total critical care time: 45 minutes Critical care time was exclusive of separately billable procedures and treating other patients. Critical care was necessary to treat or prevent imminent or life-threatening deterioration. Critical care was time spent personally by me on the following activities: development of treatment plan with patient and/or surrogate as well as nursing, discussions with consultants, evaluation of patient's response to treatment, examination of patient, obtaining history from patient or surrogate, ordering and performing treatments and interventions, ordering and review of laboratory studies, ordering and review of  radiographic studies, pulse oximetry and re-evaluation of patient's condition. Patient with persistent tachycardia despite receiving IV fluids and Ativan Medications Ordered in ED Medications  lactated ringers bolus 2,000 mL (0 mLs Intravenous Stopped 07/23/18 0434)  ondansetron (ZOFRAN) injection 4 mg (4 mg Intravenous Given 07/23/18 0358)  LORazepam (ATIVAN) injection 2 mg (2 mg Intravenous Given 07/23/18 0357)  sodium chloride 0.9 % bolus 1,000 mL (0 mLs Intravenous Stopped 07/23/18 0721)  LORazepam (ATIVAN) injection 1 mg (1 mg Intravenous Given 07/23/18 0631)     Initial Impression / Assessment and Plan / ED Course  I have reviewed the triage vital signs and the nursing notes.  Pertinent labs & imaging results that were available during my care of the patient were reviewed by me and considered in my medical decision making (see chart for details).     4:44 AM Presents for multiple complaints including vomiting diarrhea with bloody stools as well as shortness of breath and pain on her left side of her body.  Clinically she appears tremulous and anxious, and appears dehydrated.  Her abdominal exam is unremarkable.  EKG reveals sinus tachycardia, chest x-ray is negative.  I am strongly suspicious for alcohol-related symptoms or even possible early withdrawal.  She is tachycardic and hypertensive, and very tremulous, initially reported she only drinks once a week but then admitted to drinking wine just a few hours ago.  Her alcohol level is elevated.  She has a previous history of alcohol abuse 7:22 AM After 2.5 L of fluid and 3 mg of Ativan patient still tachycardic to the 120s.  Her hypertension is returning.  She is still tremulous.  I am suspicious that she is beginning to show signs of alcohol withdrawal.  She also reports she has had multiple episodes of diarrhea while here in the ER.  Suspect this is a combination of dehydration and alcohol withdrawal.  She denies any chest pain or abdominal  pain at this time.   Patient refuses transfer for admission at this time.  She wants her mother to pick her up and drive her to either  [facility].  I advised against this, but patient insists on leaving.  Patient should be admitted for alcohol withdrawal, dehydration and persistent tachycardia.  I discussed risk of leaving against  medical advice and the patient accepts these risks.  The patient is awake/alert able to make decisions, Patient discharged against medical advice.   Final Clinical Impressions(s) / ED Diagnoses   Final diagnoses:  Dehydration  Nausea vomiting and diarrhea  Alcohol abuse    ED Discharge Orders    None       Zadie Rhine, MD 07/23/18 [phone]

## 2018-07-23 NOTE — ED Notes (Signed)
ED Provider at bedside. 

## 2018-07-23 NOTE — ED Triage Notes (Signed)
Pt states that she had a cholesectomy 2 weeks ago. Since has been having left sided pain and tonight became short of breath around 2200. Pt endores chills and nausea. Also states darker red color in her bowel movements

## 2018-07-25 ENCOUNTER — Telehealth: Payer: Self-pay

## 2018-07-25 NOTE — Telephone Encounter (Signed)
Called patient to see if shew wanted an ED follow up with Dr. Carmelia Roller.

## 2018-08-21 ENCOUNTER — Encounter: Payer: Self-pay | Admitting: Family Medicine

## 2018-09-04 ENCOUNTER — Encounter: Payer: Self-pay | Admitting: Family Medicine

## 2018-09-04 ENCOUNTER — Other Ambulatory Visit: Payer: Self-pay | Admitting: Family Medicine

## 2018-09-04 DIAGNOSIS — F411 Generalized anxiety disorder: Secondary | ICD-10-CM

## 2018-09-04 NOTE — Telephone Encounter (Signed)
Could you please follow up with patient this afternoon to make sure she got my message.  ER needs to assess her chest pain and can also help her assess the severity of her alcohol withdrawal symptoms and best treatment options.

## 2018-09-04 NOTE — Telephone Encounter (Signed)
Noted.  Dr. Carmelia Roller- FYI.

## 2018-09-23 ENCOUNTER — Encounter (HOSPITAL_BASED_OUTPATIENT_CLINIC_OR_DEPARTMENT_OTHER): Payer: Self-pay | Admitting: *Deleted

## 2018-09-23 ENCOUNTER — Emergency Department (HOSPITAL_BASED_OUTPATIENT_CLINIC_OR_DEPARTMENT_OTHER)
Admission: EM | Admit: 2018-09-23 | Discharge: 2018-09-23 | Disposition: A | Discharge status: Home / Self Care | Payer: 59 | Attending: Emergency Medicine | Admitting: Emergency Medicine

## 2018-09-23 ENCOUNTER — Other Ambulatory Visit: Payer: Self-pay

## 2018-09-23 DIAGNOSIS — H9209 Otalgia, unspecified ear: Secondary | ICD-10-CM | POA: Insufficient documentation

## 2018-09-23 DIAGNOSIS — Z79899 Other long term (current) drug therapy: Secondary | ICD-10-CM | POA: Insufficient documentation

## 2018-09-23 DIAGNOSIS — I10 Essential (primary) hypertension: Secondary | ICD-10-CM | POA: Insufficient documentation

## 2018-09-23 DIAGNOSIS — R0981 Nasal congestion: Secondary | ICD-10-CM | POA: Insufficient documentation

## 2018-09-23 DIAGNOSIS — J069 Acute upper respiratory infection, unspecified: Secondary | ICD-10-CM | POA: Insufficient documentation

## 2018-09-23 MED ORDER — ACETAMINOPHEN 500 MG PO TABS
1000.0000 mg | ORAL_TABLET | Freq: Once | ORAL | Status: AC
  Administered 2018-09-23: 1000 mg via ORAL
  Filled 2018-09-23: qty 2

## 2018-09-23 MED ORDER — LORATADINE 10 MG PO TABS
10.0000 mg | ORAL_TABLET | Freq: Once | ORAL | Status: AC
  Administered 2018-09-23: 10 mg via ORAL
  Filled 2018-09-23: qty 1

## 2018-09-23 MED ORDER — CETIRIZINE-PSEUDOEPHEDRINE ER 5-120 MG PO TB12: 1.0000 | ORAL_TABLET | Freq: Two times a day (BID) | ORAL | 0 refills | Status: DC | PRN

## 2018-09-23 MED ORDER — PSEUDOEPHEDRINE HCL 30 MG PO TABS
30.0000 mg | ORAL_TABLET | Freq: Once | ORAL | Status: AC
  Administered 2018-09-23: 30 mg via ORAL
  Filled 2018-09-23: qty 1

## 2018-09-23 NOTE — ED Provider Notes (Signed)
[facility] EMERGENCY DEPARTMENT Provider Note   CSN: [id number] Arrival date & time: 09/23/18  0710     History   Chief Complaint Chief Complaint  Patient presents with  . URI    HPI Berdene Askari Reiger is a 32 y.o. female.  Patient c/o sinus congestion, sore throat, ear fullness/soreness, for the past few days. Symptoms constant, moderate, persistent, felt worse this AM. Was started on amoxicllin for ear infection yesterday.  Denies severe headaches. No neck pain or stiffness. No drainage from ears or new hearing loss. Sore throat bil, no unilateral throat pain or swelling. Occasional non prod cough. No cp or sob. No vomiting or diarrhea. No rash.   The history is provided by the patient.  URI   Associated symptoms include congestion, rhinorrhea, sore throat and cough. Pertinent negatives include no chest pain, no abdominal pain, no diarrhea, no vomiting, no neck pain and no rash.    Past Medical History:  Diagnosis Date  . Anxiety   . Depression   . Frequent headaches   . Hard of hearing   . Hypertension     Patient Active Problem List   Diagnosis Date Noted  . Essential hypertension 04/27/2018  . GAD (generalized anxiety disorder) 04/27/2018    Past Surgical History:  Procedure Laterality Date  . CESAREAN SECTION    . CHOLECYSTECTOMY N/A 07/07/2018   Procedure: LAPAROSCOPIC CHOLECYSTECTOMY WITH INTRAOPERATIVE CHOLANGIOGRAM ERAS PATHWAY;  Surgeon: Griselda Miner, MD;  Location: [facility];  Service: General;  Laterality: N/A;  . TONSILLECTOMY AND ADENOIDECTOMY       OB History    Gravida  1   Para  1   Term  1   Preterm      AB      Living  1     SAB      TAB      Ectopic      Multiple      Live Births               Home Medications    Prior to Admission medications   Medication Sig Start Date End Date Taking? Authorizing Provider  amLODipine (NORVASC) 5 MG tablet Take 5 mg by mouth daily.    [provider]    HYDROcodone-acetaminophen (NORCO/VICODIN) 5-325 MG tablet Take 1-2 tablets by mouth every 6 (six) hours as needed. 07/07/18   Chevis Pretty III, MD  hydrOXYzine (ATARAX/VISTARIL) 25 MG tablet TAKE 1 TO 3 TABLETS(25 TO 75 MG) BY MOUTH THREE TIMES DAILY AS NEEDED FOR ANXIETY 09/04/18   Wendling, Jilda Roche, DO  ibuprofen (ADVIL,MOTRIN) 200 MG tablet Take 600 mg by mouth every 6 (six) hours as needed for headache or moderate pain.     [provider]  multivitamin [facility]) per tablet Take 1 tablet by mouth daily.    [provider]  norgestimate-ethinyl estradiol (ORTHO-CYCLEN,SPRINTEC,PREVIFEM) 0.25-35 MG-MCG tablet Take 1 tablet by mouth daily.    [provider]  sertraline (ZOLOFT) 50 MG tablet Take 50 mg by mouth daily.    [provider]    Family History Family History  Problem Relation Age of Onset  . Arthritis Mother   . Depression Mother   . Hypertension Mother     Social History Social History   Tobacco Use  . Smoking status: Never Smoker  . Smokeless tobacco: Never Used  Substance Use Topics  . Alcohol use: Yes    Comment: occ  . Drug use: Not Currently  Comment: pt denies a history, but cocaine listed from previous visit      Allergies   Percocet [oxycodone-acetaminophen]   Review of Systems Review of Systems  Constitutional: Negative for fever.  HENT: Positive for congestion, rhinorrhea and sore throat.   Eyes: Negative for redness.  Respiratory: Positive for cough. Negative for shortness of breath.   Cardiovascular: Negative for chest pain.  Gastrointestinal: Negative for abdominal pain, diarrhea and vomiting.  Genitourinary: Negative for flank pain.  Musculoskeletal: Negative for neck pain and neck stiffness.  Skin: Negative for rash.  Neurological:       Sinus area pain. No severe headaches.   Hematological: Does not bruise/bleed easily.  Psychiatric/Behavioral: Negative for confusion.     Physical  Exam Updated Vital Signs BP (!) 154/109 (BP Location: Right Arm)   Pulse (!) 111   Temp 97.9 F (36.6 C) (Oral)   Resp 18   Ht 1.702 m (5\' 7" )   Wt 93.4 kg   LMP 09/23/2018   SpO2 99%   BMI 32.26 kg/m   Physical Exam  Constitutional: She appears well-developed and well-nourished.  HENT:  Mouth/Throat: Oropharynx is clear and moist.  No asymmetric swelling or peritonsillar abscess noted. eac clear. tms with fluid/congestion. Mild maxillary sinus area pain/tenderness.   Eyes: Conjunctivae are normal. No scleral icterus.  Neck: Neck supple. No tracheal deviation present. No thyromegaly present.  No neck pain, stiffness or rigidity.   Cardiovascular: Normal rate, regular rhythm, normal heart sounds and intact distal pulses. Exam reveals no gallop and no friction rub.  No murmur heard. Pulmonary/Chest: Effort normal and breath sounds normal. No stridor. No respiratory distress. She has no wheezes. She has no rales.  Abdominal: Soft. Normal appearance. She exhibits no distension. There is no tenderness.  No hsm.   Musculoskeletal: She exhibits no edema.  Lymphadenopathy:    She has no cervical adenopathy.  Neurological: She is alert.  Skin: Skin is warm and dry. No rash noted.  Psychiatric: She has a normal mood and affect.  Nursing note and vitals reviewed.    ED Treatments / Results  Labs (all labs ordered are listed, but only abnormal results are displayed) Labs Reviewed - No data to display  EKG None  Radiology No results found.  Procedures Procedures (including critical care time)  Medications Ordered in ED Medications  acetaminophen (TYLENOL) tablet 1,000 mg (1,000 mg Oral Given 09/23/18 0758)  loratadine (CLARITIN) tablet 10 mg (10 mg Oral Given 09/23/18 0758)  pseudoephedrine (SUDAFED) tablet 30 mg (30 mg Oral Given 09/23/18 0758)     Initial Impression / Assessment and Plan / ED Course  I have reviewed the triage vital signs and the nursing  notes.  Pertinent labs & imaging results that were available during my care of the patient were reviewed by me and considered in my medical decision making (see chart for details).  Pt just recently placed on amoxicillin for ?sinus infection.   Pt with uri symptoms. Will add zyrtec-d for congestion/symptom relief.   No meds this AM as of yet for pain. Acetaminophen po. Po fluids.  Patient currently appears stable for d/c.    Final Clinical Impressions(s) / ED Diagnoses   Final diagnoses:  None    ED Discharge Orders    None       Cathren Laine, MD 09/23/18 [phone]

## 2018-09-23 NOTE — Discharge Instructions (Addendum)
It was our pleasure to provide your ER care today - we hope that you feel better.  Take zyrtec-d as need for sinus congestion and/or pain (is available over the counter).  Complete the course of your antibiotic.  Take acetaminophen and/or ibuprofen as need for pain.  Drink plenty of fluids.   Follow up with primary care doctor in the coming week if symptoms fail to improve/resolve.  Return to ER if worse, new symptoms, trouble breathing, worsening/severe pain, other concern.

## 2018-09-23 NOTE — ED Triage Notes (Signed)
Dx with ear infection and started on amoxicillin yesterday.  States that her ears do not feel better and that her head hurts.  Denies OTC pain meds PTA.

## 2018-10-05 ENCOUNTER — Ambulatory Visit: Payer: Self-pay | Admitting: Psychology

## 2018-10-28 ENCOUNTER — Emergency Department (HOSPITAL_BASED_OUTPATIENT_CLINIC_OR_DEPARTMENT_OTHER): Payer: Self-pay

## 2018-10-28 ENCOUNTER — Other Ambulatory Visit: Payer: Self-pay

## 2018-10-28 ENCOUNTER — Observation Stay (HOSPITAL_BASED_OUTPATIENT_CLINIC_OR_DEPARTMENT_OTHER)
Admission: EM | Admit: 2018-10-28 | Discharge: 2018-10-28 | Disposition: A | Discharge status: Home / Self Care | Payer: Self-pay | Attending: Internal Medicine | Admitting: Internal Medicine

## 2018-10-28 ENCOUNTER — Encounter (HOSPITAL_BASED_OUTPATIENT_CLINIC_OR_DEPARTMENT_OTHER): Payer: Self-pay | Admitting: *Deleted

## 2018-10-28 DIAGNOSIS — R200 Anesthesia of skin: Secondary | ICD-10-CM | POA: Insufficient documentation

## 2018-10-28 DIAGNOSIS — F329 Major depressive disorder, single episode, unspecified: Secondary | ICD-10-CM | POA: Insufficient documentation

## 2018-10-28 DIAGNOSIS — R0789 Other chest pain: Principal | ICD-10-CM | POA: Insufficient documentation

## 2018-10-28 DIAGNOSIS — I10 Essential (primary) hypertension: Secondary | ICD-10-CM | POA: Insufficient documentation

## 2018-10-28 DIAGNOSIS — G459 Transient cerebral ischemic attack, unspecified: Secondary | ICD-10-CM | POA: Diagnosis present

## 2018-10-28 DIAGNOSIS — F419 Anxiety disorder, unspecified: Secondary | ICD-10-CM | POA: Insufficient documentation

## 2018-10-28 DIAGNOSIS — E876 Hypokalemia: Secondary | ICD-10-CM | POA: Insufficient documentation

## 2018-10-28 LAB — APTT: aPTT: 27 seconds (ref 24–36)

## 2018-10-28 LAB — COMPREHENSIVE METABOLIC PANEL
ALT: 38 U/L (ref 0–44)
AST: 166 U/L — AB (ref 15–41)
Albumin: 3.5 g/dL (ref 3.5–5.0)
Alkaline Phosphatase: 58 U/L (ref 38–126)
Anion gap: 13 (ref 5–15)
BUN: 6 mg/dL (ref 6–20)
CO2: 23 mmol/L (ref 22–32)
Calcium: 8.1 mg/dL — ABNORMAL LOW (ref 8.9–10.3)
Chloride: 102 mmol/L (ref 98–111)
Creatinine, Ser: 0.65 mg/dL (ref 0.44–1.00)
GFR calc Af Amer: >60 mL/min (ref >60)
GFR calc non Af Amer: >60 mL/min (ref >60)
Glucose, Bld: 107 mg/dL — ABNORMAL HIGH (ref 70–99)
Potassium: 2.4 mmol/L — CL (ref 3.5–5.1)
Sodium: 138 mmol/L (ref 135–145)
Total Bilirubin: 0.9 mg/dL (ref 0.3–1.2)
Total Protein: 6.7 g/dL (ref 6.5–8.1)

## 2018-10-28 LAB — DIFFERENTIAL
Abs Immature Granulocytes: 0.03 10*3/uL (ref 0.00–0.07)
Basophils Absolute: 0 10*3/uL (ref 0.0–0.1)
Basophils Relative: 0 %
Eosinophils Absolute: 0 10*3/uL (ref 0.0–0.5)
Eosinophils Relative: 0 %
Immature Granulocytes: 0 %
LYMPHS PCT: 21 %
Lymphs Abs: 1.5 10*3/uL (ref 0.7–4.0)
MONOS PCT: 5 %
Monocytes Absolute: 0.4 10*3/uL (ref 0.1–1.0)
Neutro Abs: 5.3 10*3/uL (ref 1.7–7.7)
Neutrophils Relative %: 74 %

## 2018-10-28 LAB — PREGNANCY, URINE: Preg Test, Ur: NEGATIVE

## 2018-10-28 LAB — CBC
HEMATOCRIT: 35.3 % — AB (ref 36.0–46.0)
Hemoglobin: 11.8 g/dL — ABNORMAL LOW (ref 12.0–15.0)
MCH: 35.1 pg — ABNORMAL HIGH (ref 26.0–34.0)
MCHC: 33.4 g/dL (ref 30.0–36.0)
MCV: 105.1 fL — AB (ref 80.0–100.0)
Platelets: 193 10*3/uL (ref 150–400)
RBC: 3.36 MIL/uL — ABNORMAL LOW (ref 3.87–5.11)
RDW: 17.2 % — ABNORMAL HIGH (ref 11.5–15.5)
WBC: 7.3 10*3/uL (ref 4.0–10.5)
nRBC: 0 % (ref 0.0–0.2)

## 2018-10-28 LAB — PROTIME-INR
INR: 0.95
Prothrombin Time: 12.6 seconds (ref 11.4–15.2)

## 2018-10-28 LAB — CBG MONITORING, ED: Glucose-Capillary: 103 mg/dL — ABNORMAL HIGH (ref 70–99)

## 2018-10-28 LAB — MAGNESIUM: Magnesium: 1.2 mg/dL — ABNORMAL LOW (ref 1.7–2.4)

## 2018-10-28 LAB — PHOSPHORUS: Phosphorus: 3.6 mg/dL (ref 2.5–4.6)

## 2018-10-28 LAB — TSH: TSH: 2.045 u[IU]/mL (ref 0.350–4.500)

## 2018-10-28 LAB — TROPONIN I

## 2018-10-28 MED ORDER — POTASSIUM CHLORIDE 10 MEQ/100ML IV SOLN
10.0000 meq | Freq: Once | INTRAVENOUS | Status: AC
  Administered 2018-10-28: 10 meq via INTRAVENOUS
  Filled 2018-10-28: qty 100

## 2018-10-28 MED ORDER — LACTATED RINGERS IV SOLN
INTRAVENOUS | Status: DC
  Administered 2018-10-28: 20:00:00 via INTRAVENOUS

## 2018-10-28 MED ORDER — POTASSIUM CHLORIDE CRYS ER 20 MEQ PO TBCR: 20.0000 meq | EXTENDED_RELEASE_TABLET | Freq: Two times a day (BID) | ORAL | 0 refills | Status: DC

## 2018-10-28 MED ORDER — ASPIRIN EC 325 MG PO TBEC
325.0000 mg | DELAYED_RELEASE_TABLET | Freq: Once | ORAL | Status: AC
  Administered 2018-10-28: 325 mg via ORAL
  Filled 2018-10-28: qty 1

## 2018-10-28 MED ORDER — MAGNESIUM OXIDE 400 (241.3 MG) MG PO TABS
800.0000 mg | ORAL_TABLET | Freq: Once | ORAL | Status: AC
  Administered 2018-10-28: 800 mg via ORAL
  Filled 2018-10-28: qty 2

## 2018-10-28 MED ORDER — MAGNESIUM OXIDE -MG SUPPLEMENT 200 MG PO TABS: 400.0000 mg | ORAL_TABLET | Freq: Every day | ORAL | 0 refills | Status: DC

## 2018-10-28 MED ORDER — LORAZEPAM 2 MG/ML IJ SOLN
1.0000 mg | Freq: Once | INTRAMUSCULAR | Status: AC
  Administered 2018-10-28: 1 mg via INTRAVENOUS
  Filled 2018-10-28: qty 1

## 2018-10-28 MED ORDER — LACTATED RINGERS IV BOLUS
1000.0000 mL | Freq: Once | INTRAVENOUS | Status: AC
  Administered 2018-10-28: 1000 mL via INTRAVENOUS

## 2018-10-28 MED ORDER — POTASSIUM CHLORIDE CRYS ER 20 MEQ PO TBCR
40.0000 meq | EXTENDED_RELEASE_TABLET | Freq: Once | ORAL | Status: AC
  Administered 2018-10-28: 40 meq via ORAL
  Filled 2018-10-28: qty 2

## 2018-10-28 NOTE — ED Triage Notes (Signed)
Pt states she was driving today. States her vision "got blurry" and then she became dizzy and her left arm became numb. She had her daughter with her so she pulled over and called family to bring her to ED. Pt tearful in triage. States she has a hx of anxiety and HTN. No drift, facial symmetry present, grips strong and equal, Reports numbness left hand. C/o pain in left side of neck prior to numbness in left arm

## 2018-10-28 NOTE — ED Notes (Signed)
Pt and family understood dc material. NAD noted. Scripts given at dc 

## 2018-10-28 NOTE — ED Notes (Signed)
K+ 2.4, results given to RN and MD

## 2018-10-28 NOTE — ED Provider Notes (Signed)
[facility] EMERGENCY DEPARTMENT Provider Note   CSN: [id number] Arrival date & time: 10/28/18  1405     History   Chief Complaint Chief Complaint  Patient presents with  . Dizziness  . Numbness    HPI Julie Kennedy is a 32 y.o. female.  HPI Patient reports that she was driving at 1 PM when very suddenly her vision got blurry and she felt dizzy.  In association with that she felt like her left arm was numb.  She was driving so she pulled over and called her family who brought her to the emergency department.  Patient reports that she had a slight headache.  No loss of consciousness.  No nausea no vomiting.  No lower extremity symptoms.  No weakness or incoordination.  Patient reports occasionally in the past she has gotten a chest pain with some arm symptoms but never anything similar to today.  She denies history of waxing and waning weakness numbness or tingling.  She denies history of headaches.  Patient does take blood pressure medications.  She reports he is compliant.  She reports that symptoms are less now than they were previously.  She still perceives some numbness of the left arm. Patient reports has had some very mild cold symptoms recently.  Her child has been sick with cold symptoms.  Review of systems otherwise negative. Past Medical History:  Diagnosis Date  . Anxiety   . Depression   . Frequent headaches   . Hard of hearing   . Hypertension     Patient Active Problem List   Diagnosis Date Noted  . TIA (transient ischemic attack) 10/28/2018  . Essential hypertension 04/27/2018  . GAD (generalized anxiety disorder) 04/27/2018    Past Surgical History:  Procedure Laterality Date  . CESAREAN SECTION    . CHOLECYSTECTOMY N/A 07/07/2018   Procedure: LAPAROSCOPIC CHOLECYSTECTOMY WITH INTRAOPERATIVE CHOLANGIOGRAM ERAS PATHWAY;  Surgeon: Griselda Miner, MD;  Location: [facility] ORS;  Service: General;  Laterality: N/A;  . TONSILLECTOMY AND ADENOIDECTOMY        OB History    Gravida  1   Para  1   Term  1   Preterm      AB      Living  1     SAB      TAB      Ectopic      Multiple      Live Births               Home Medications    Prior to Admission medications   Medication Sig Start Date End Date Taking? Authorizing Provider  amLODipine (NORVASC) 5 MG tablet Take 5 mg by mouth daily.   Yes [provider]  hydrOXYzine (ATARAX/VISTARIL) 25 MG tablet TAKE 1 TO 3 TABLETS(25 TO 75 MG) BY MOUTH THREE TIMES DAILY AS NEEDED FOR ANXIETY 09/04/18  Yes Wendling, Jilda Roche, DO  norgestimate-ethinyl estradiol (ORTHO-CYCLEN,SPRINTEC,PREVIFEM) 0.25-35 MG-MCG tablet Take 1 tablet by mouth daily.   Yes [provider]  sertraline (ZOLOFT) 50 MG tablet Take 50 mg by mouth daily.   Yes [provider]  cetirizine-pseudoephedrine (ZYRTEC-D) 5-120 MG tablet Take 1 tablet by mouth 2 (two) times daily as needed (sinus congestion/pain). 09/23/18   Cathren Laine, MD  HYDROcodone-acetaminophen (NORCO/VICODIN) 5-325 MG tablet Take 1-2 tablets by mouth every 6 (six) hours as needed. 07/07/18   Chevis Pretty III, MD  ibuprofen (ADVIL,MOTRIN) 200 MG tablet Take 600 mg by mouth every 6 (  six) hours as needed for headache or moderate pain.     [provider]  multivitamin [facility](THERAGRAN) per tablet Take 1 tablet by mouth daily.    [provider]    Family History Family History  Problem Relation Age of Onset  . Arthritis Mother   . Depression Mother   . Hypertension Mother     Social History Social History   Tobacco Use  . Smoking status: Never Smoker  . Smokeless tobacco: Never Used  Substance Use Topics  . Alcohol use: Yes    Comment: occ  . Drug use: Not Currently    Comment: pt denies a history, but cocaine listed from previous visit      Allergies   Percocet [oxycodone-acetaminophen]   Review of Systems Review of Systems 10 Systems reviewed and are negative for acute change  except as noted in the HPI.  Physical Exam Updated Vital Signs BP (!) 156/104   Pulse (!) 109   Temp 98.3 F (36.8 C) (Oral)   Resp (!) 26   Ht 5\' 8"  (1.727 m)   Wt 95.3 kg   LMP 10/26/2018   SpO2 100%   BMI 31.93 kg/m   Physical Exam  Constitutional: She is oriented to person, place, and time. She appears well-developed and well-nourished. No distress.  Patient is alert and nontoxic.  Mental status is clear.  She does appear slightly anxious.  No respiratory distress but mild hyperventilation.  HENT:  Head: Normocephalic and atraumatic.  Right Ear: External ear normal.  Left Ear: External ear normal.  Mouth/Throat: Oropharynx is clear and moist.  Eyes: Pupils are equal, round, and reactive to light. EOM are normal.  Cardiovascular: Normal rate, regular rhythm, normal heart sounds and intact distal pulses.  Pulmonary/Chest: Effort normal and breath sounds normal.  Abdominal: Soft. She exhibits no distension. There is no tenderness. There is no guarding.  Musculoskeletal: Normal range of motion. She exhibits no edema or tenderness.  Neurological: She is alert and oriented to person, place, and time. No cranial nerve deficit. She exhibits normal muscle tone. Coordination normal.  Patient's mental status is normal.  Speech is normal.  Upper and lower motor strength 5\5.  Patient endorses slight decrease to light touch on the left upper extremity to the top of the shoulder.  Lower extremities no differential between light touch.  Skin: Skin is warm and dry.  Psychiatric:  Patient is appropriate and interactive.  She appears mildly anxious but otherwise normal.     ED Treatments / Results  Labs (all labs ordered are listed, but only abnormal results are displayed) Labs Reviewed  CBC - Abnormal; Notable for the following components:      Result Value   RBC 3.36 (*)    Hemoglobin 11.8 (*)    HCT 35.3 (*)    MCV 105.1 (*)    MCH 35.1 (*)    RDW 17.2 (*)    All other  components within normal limits  COMPREHENSIVE METABOLIC PANEL - Abnormal; Notable for the following components:   Potassium 2.4 (*)    Glucose, Bld 107 (*)    Calcium 8.1 (*)    AST 166 (*)    All other components within normal limits  CBG MONITORING, ED - Abnormal; Notable for the following components:   Glucose-Capillary 103 (*)    All other components within normal limits  PROTIME-INR  APTT  DIFFERENTIAL  TROPONIN I  PREGNANCY, URINE  TSH  MAGNESIUM  PHOSPHORUS  CBG MONITORING,  ED    EKG None EKG not importing from MUSE.  Sinus rhythm.  No acute ST-T wave changes.  No change from previous.  Radiology Ct Head Code Stroke Wo Contrast  Result Date: 10/28/2018 CLINICAL DATA:  Code stroke. Left-sided numbness affecting the hand and face beginning 90 minutes ago. EXAM: CT HEAD WITHOUT CONTRAST TECHNIQUE: Contiguous axial images were obtained from the base of the skull through the vertex without intravenous contrast. COMPARISON:  None. FINDINGS: Brain: Normal appearance without evidence of atrophy, old or acute infarction, mass lesion, hemorrhage, hydrocephalus or extra-axial collection. Vascular: No abnormal vascular finding. Skull: Normal Sinuses/Orbits: Clear except for insignificant retention cysts. Orbits negative. Other: None ASPECTS (Alberta Stroke Program Early CT Score) - Ganglionic level infarction (caudate, lentiform nuclei, internal capsule, insula, M1-M3 cortex): 7 - Supraganglionic infarction (M4-M6 cortex): 3 Total score (0-10 with 10 being normal): 10 IMPRESSION: 1. Normal head CT. 2. ASPECTS is 10. 3. These results were called to Dr. Charm Barges at 1448 hours. Electronically Signed   By: Paulina Fusi M.D.   On: 10/28/2018 14:49    Procedures Procedures (including critical care time) CRITICAL CARE Performed by: Arby Barrette   Total critical care time: 30 minutes  Critical care time was exclusive of separately billable procedures and treating other patients.  Critical  care was necessary to treat or prevent imminent or life-threatening deterioration.  Critical care was time spent personally by me on the following activities: development of treatment plan with patient and/or surrogate as well as nursing, discussions with consultants, evaluation of patient's response to treatment, examination of patient, obtaining history from patient or surrogate, ordering and performing treatments and interventions, ordering and review of laboratory studies, ordering and review of radiographic studies, pulse oximetry and re-evaluation of patient's condition. Medications Ordered in ED Medications  potassium chloride 10 mEq in 100 mL IVPB (has no administration in time range)  lactated ringers bolus 1,000 mL (has no administration in time range)  lactated ringers infusion (has no administration in time range)  LORazepam (ATIVAN) injection 1 mg (1 mg Intravenous Given 10/28/18 1515)  potassium chloride SA (K-DUR,KLOR-CON) CR tablet 40 mEq (40 mEq Oral Given 10/28/18 1604)  aspirin EC tablet 325 mg (325 mg Oral Given 10/28/18 1604)     Initial Impression / Assessment and Plan / ED Course  I have reviewed the triage vital signs and the nursing notes.  Pertinent labs & imaging results that were available during my care of the patient were reviewed by me and considered in my medical decision making (see chart for details).  Clinical Course as of Oct 28 1612  Sat Oct 28, 2018  1545 Consult: Reviewed with Dr.Aroor  We reviewed the interpretation and evaluation by telemetry neurologist.  At this time, we will proceed with hospitalist admission and neuro hospitalist consultation. Consult: I have reviewed with hospitalist Dr. Sharyon Medicus.  Accepts for admission.   [MP]    Clinical Course User Index [MP] Arby Barrette, MD   Patient presented with symptoms as outlined above.  Recommendation by telemetry neurology was for admission and further imaging.  Patient did undergo MRI with no acute  findings.  She had already been admitted and plan for further diagnostic evaluation.  Patient however then determined that she did not want to stay as admitted patient.  I did counsel her of the hypokalemia in addition to the symptoms for which she presented.  Patient was clear that she did not wish to stay in the hospital any longer and she  suspect her symptoms were due to her anxiety.  Patient is left AMA before recommended admission.  Return precautions reviewed.   Final Clinical Impressions(s) / ED Diagnoses   Final diagnoses:  Hypokalemia  Left arm numbness    ED Discharge Orders    None       Arby Barrette, MD 11/01/18 1335

## 2018-10-28 NOTE — Discharge Instructions (Signed)
1.  You must follow-up with your family doctor this week.  You need to address both the weakness and numbness that brought you to the hospital as well as low potassium and magnesium. 2.  Speak your doctor about referral to neurology. 3.  Return to the emergency department immediately if you have any new worsening or changing symptoms.

## 2018-10-28 NOTE — Progress Notes (Signed)
32 year old female past medical history presenting with few hours a history of left arm numbness with blurry vision and dizziness.  Work-up in the emergency room showed hypokalemia.  Patient will be placed on observation and telemetry.  Neurology, Dr. Jerrell BelfastAurora was contacted and is aware of the transfer

## 2018-10-28 NOTE — ED Notes (Signed)
Patient transported to MRI 

## 2018-10-28 NOTE — ED Notes (Signed)
ED Provider at bedside. 

## 2018-10-28 NOTE — ED Notes (Signed)
Pt appears anxious. Pt states she felt a pain in left side of neck and then felt numbness in her left arm. She states that she has mild chest pain.

## 2018-10-28 NOTE — Consult Note (Signed)
[facility] TeleNeurology Consult Services   TeleStroke Metrics: LKW: 1300 Door Time: 1405  [facility] Contacted: 1441  [facility] at Bedside: 1450 NIHSS: 1453 Decision on Alteplase: Not to give as the patient's NIH stroke scale score is currently 1.  She was in agreement with not to receive IV alteplase.  She has opted for a more conservative route. Interventional Candidate: Not a candidate as her symptoms are not consistent with a large vessel proximal occlusion.   Chief Complaint: Left arm numbness and chest pain   HPI: Asked to see this patient in emergent telemedicine consultation utilizing interactive audio and video technologies. ?Consultation was performed with assistance of ancillary / medical staff at bedside.   Verbal consent to perform the examination with telemedicine was obtained. Patient agreed to proceed with the consultation for acute stroke protocol.  32 year old right-handed white female who comes to the emergency room for evaluation of acute chest pain and left arm numbness.  Patient does not take any aspirin at baseline.  She has never had a stroke before.  She denies any history of migraines.  She is on birth control pills.  Patient states around 1 PM, she was in the process of driving when she had acute bilateral blurry vision.  She then became dizzy and lightheaded.  She denied any vertigo.  She then developed left arm numbness and pain.  She then had left-sided chest pain.  The numbness did extend into her left chest and left neck.  She has a mild headache.  Currently, the patient has no focal motor deficits or aphasia.  She still complains of left-sided chest pain.  She mostly has numbness in her left arm.  Head CT is negative.  I reviewed with the patient about the availability of IV alteplase.  I reviewed with her about some of the potential side effects of IV alteplase to include an approximate 6% risk of symptomatic intracranial hemorrhage, internal  bleeding, and/or angioedema.  At the end of the day, the patient was in agreement with not to receive IV alteplase as her NIH stroke scale score is currently 1.  She has opted for a more conservative approach.   PMH: Depression/anxiety and hypertension   SOC: Negative x 2.  Patient drinks occasional alcohol.  She lives with family.   FMH: Significant for depression and hypertension.  Negative for stroke   ROS: 13 point review systems were reviewed with the patient, and are all negative with the exception of the aforementioned in the history of present illness.   VS: Temperature is 98.3 F, pulse 100, respiration 18, blood pressure 144/52, oxygen saturation 100%   Exam: Patient is in no apparent distress.  Patient appears as stated age.  No obvious acute respiratory or cardiac distress.  Patient is well groomed and well-nourished. 1a- LOC: Keenly responsive - 0     1b- LOC questions: Answers both questions correctly - 0    1c- LOC commands- Performs both tasks correctly- 0    2- Gaze: Normal; no gaze paresis or gaze deviation - 0    3- Visual Fields: normal, no Visual field deficit - 0    4- Facial movements: no facial palsy - 0    5- Upper limb motor - no drift - 0    6- Lower limb motor - no drift - 0     7- Limb Coordination: absent ataxia - 0     8- Sensory: left arm sensory loss - 1     9- Language -  No aphasia - 0     10- Speech - No dysarthria -0    11- Neglect / Extinction - none found - 0   NIHSS score: 1    Diagnostic Data: CT of the head showed no acute intracranial process  Urine pregnancy test negative   Medical Data Reviewed:   1.Data?reviewed include clinical labs, radiology,?and medical tests;   2.Tests?results discussed w/performing or interpreting physician;   3.Obtaining/reviewing old medical records;   4.Obtaining?case history from another source;   5.Independent?review of image, tracing, or specimen.    Medical Decision Making:   - Extensive number of  diagnosis or management options are considered below.   - Extensive amount of complex data reviewed.   - High risk of complication and/or morbidity or mortality are associated with differential diagnostic considerations below.   - There may be?uncertain?outcome and increased probability of prolonged functional impairment or high probability of severe prolonged functional impairment associated with some of these differential diagnosis.    Differential Diagnosis for Stroke:   1.?Cardioembolic?stroke   2. Small vessel disease/lacune   3. Thromboembolic, artery-to-artery mechanism   4.?Hypercoagulable?state-related infarct   5. Transient ischemic attack   6. Thrombotic mechanism, large artery disease    Assessment: 1.  Left-sided chest pain and left arm numbness 2.  Hypertension 3.  Depression/anxiety   Recommendations: Patient can be admitted to the hospital for further work-up of her symptoms. Chest pain work-up per ER team and primary team. Reasonable to start the patient on a baby aspirin for now. Check MRI brain with and without contrast to rule out any acute intracranial process. Check MRI of the cervical spine with and without contrast to rule out any acute cervical cord process. Check echocardiogram to gauge her cardiac function. Consult local neurology team to assist with evaluation and management. Continue supportive care.   Thank you for allowing [facility] to participate in the care of your patient. Please call me, Dr. Adrienne Mocha, with any questions at [phone]. Case discussed with the ER staff and Dr. Charm Barges.   Critical Care notation:   I was called to see this critical patient emergently. I personally evaluated this critical patient for acute stroke evaluation, and determining their eligibility for IV Alteplase and interventional therapies.  I have spent approximately 8 minutes with the patient, including time at bedside, time discussing the case with other  physicians, reviewing plan of care, and time independently reviewing the records and scans.

## 2018-10-28 NOTE — ED Notes (Signed)
Patient transported to CT 

## 2018-10-30 ENCOUNTER — Telehealth: Payer: Self-pay

## 2018-10-30 NOTE — Telephone Encounter (Signed)
Called  Patient to see if she needed ED follow up appointment. Left message for return call.

## 2018-12-01 ENCOUNTER — Encounter: Payer: Self-pay | Admitting: Family Medicine

## 2018-12-08 IMAGING — CR DG CHEST 2V
2 series · 2 of 2 positions shown · non-contrast
Comparison: PA and lateral chest 03/27/2018 and 01/22/2018.

CLINICAL DATA: Left chest pain beginning today.

EXAM:
CHEST - 2 VIEW

[w chest pa]
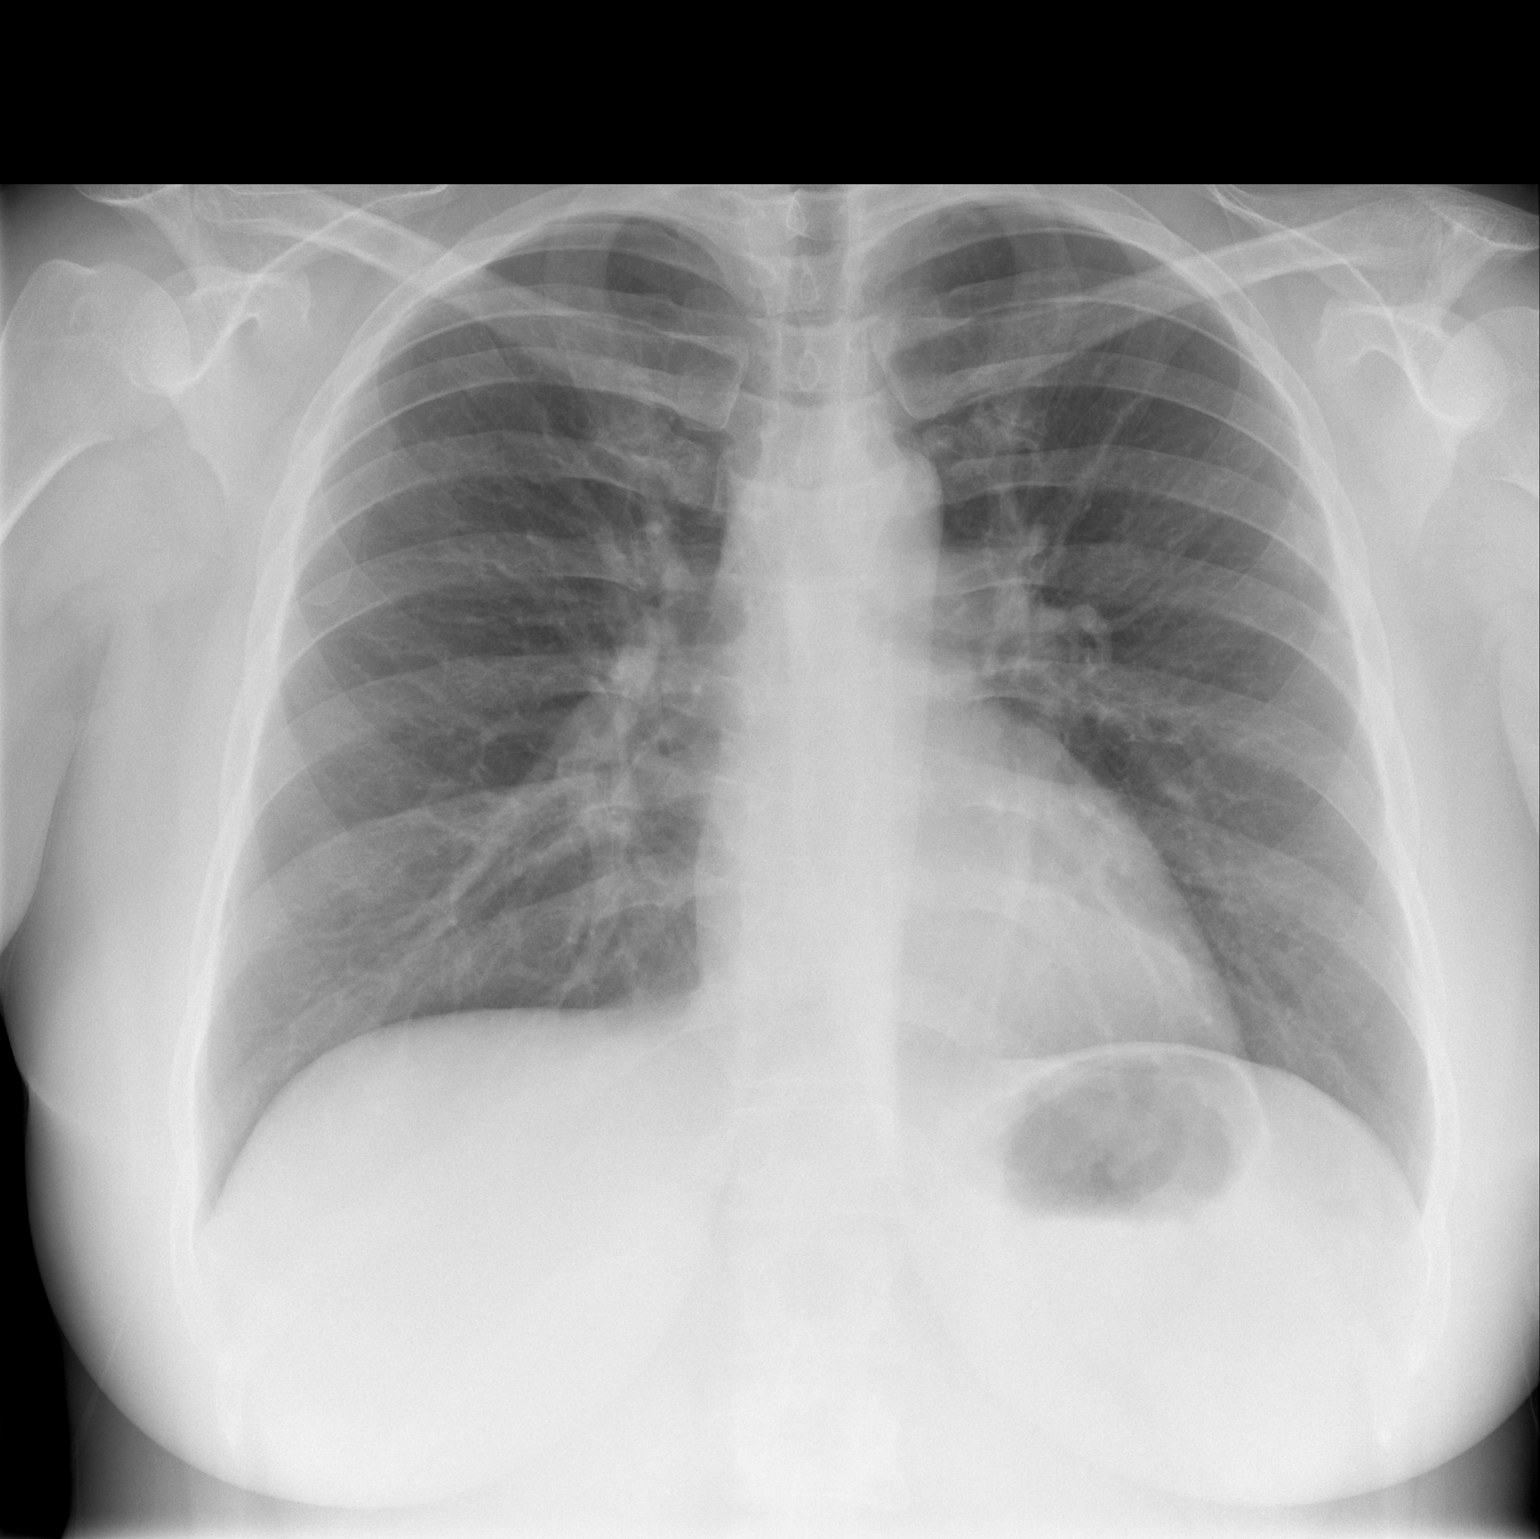

[w chest lat]
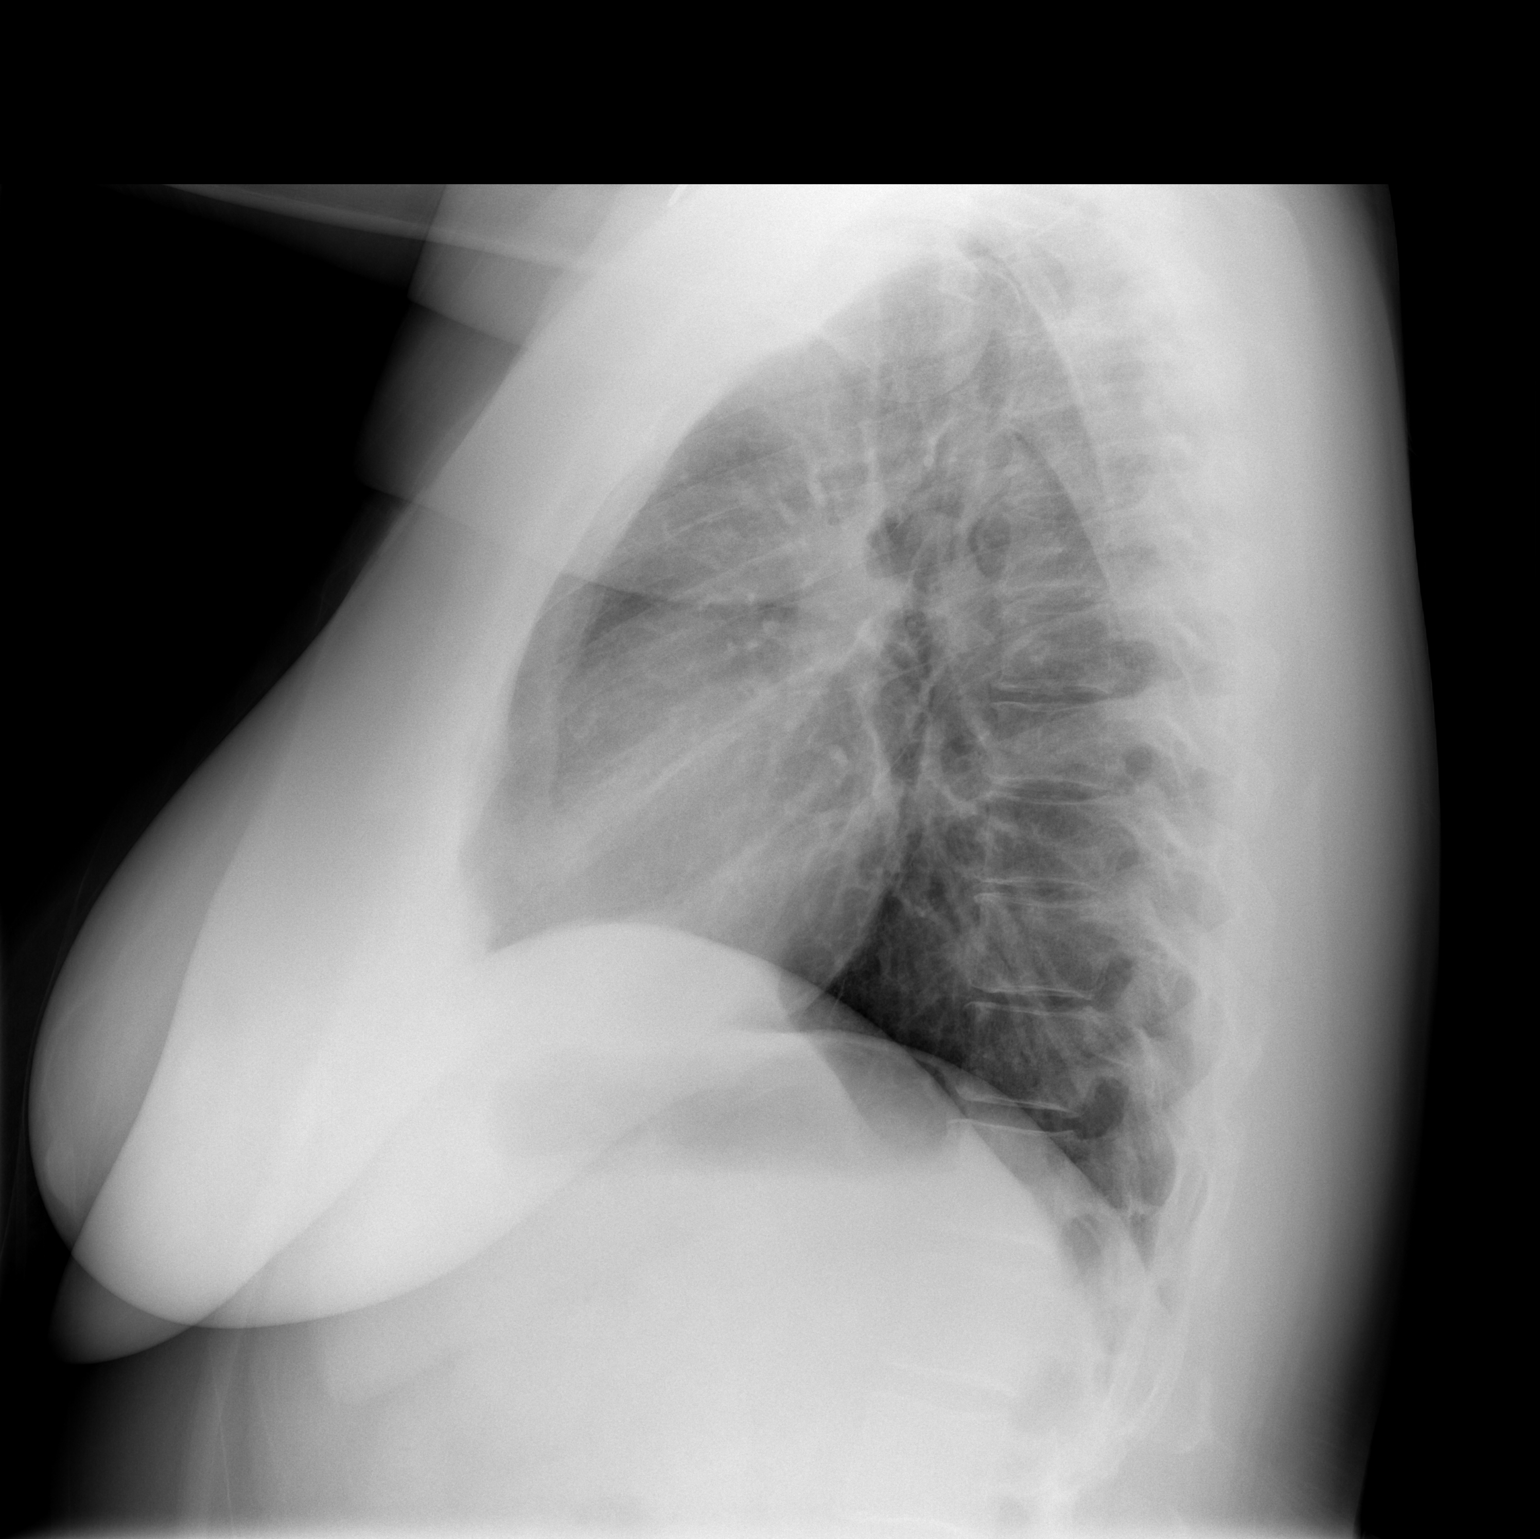

[2 of 2 positions shown; findings below may reference images not displayed]

FINDINGS: The lungs are clear. Heart size is normal. No pneumothorax or
pleural fluid. No bony abnormality.
IMPRESSION: Negative chest.

## 2019-01-29 ENCOUNTER — Other Ambulatory Visit: Payer: Self-pay | Admitting: Family Medicine

## 2019-01-29 DIAGNOSIS — F411 Generalized anxiety disorder: Secondary | ICD-10-CM

## 2019-02-20 IMAGING — DX DG CHEST 2V
2 series · 2 of 2 positions shown · non-contrast
Comparison: 04/18/2018.

CLINICAL DATA: Left upper chest pain radiating to the left arm.
Shortness of breath. Left leg pain.

EXAM:
CHEST - 2 VIEW

[chest pa]
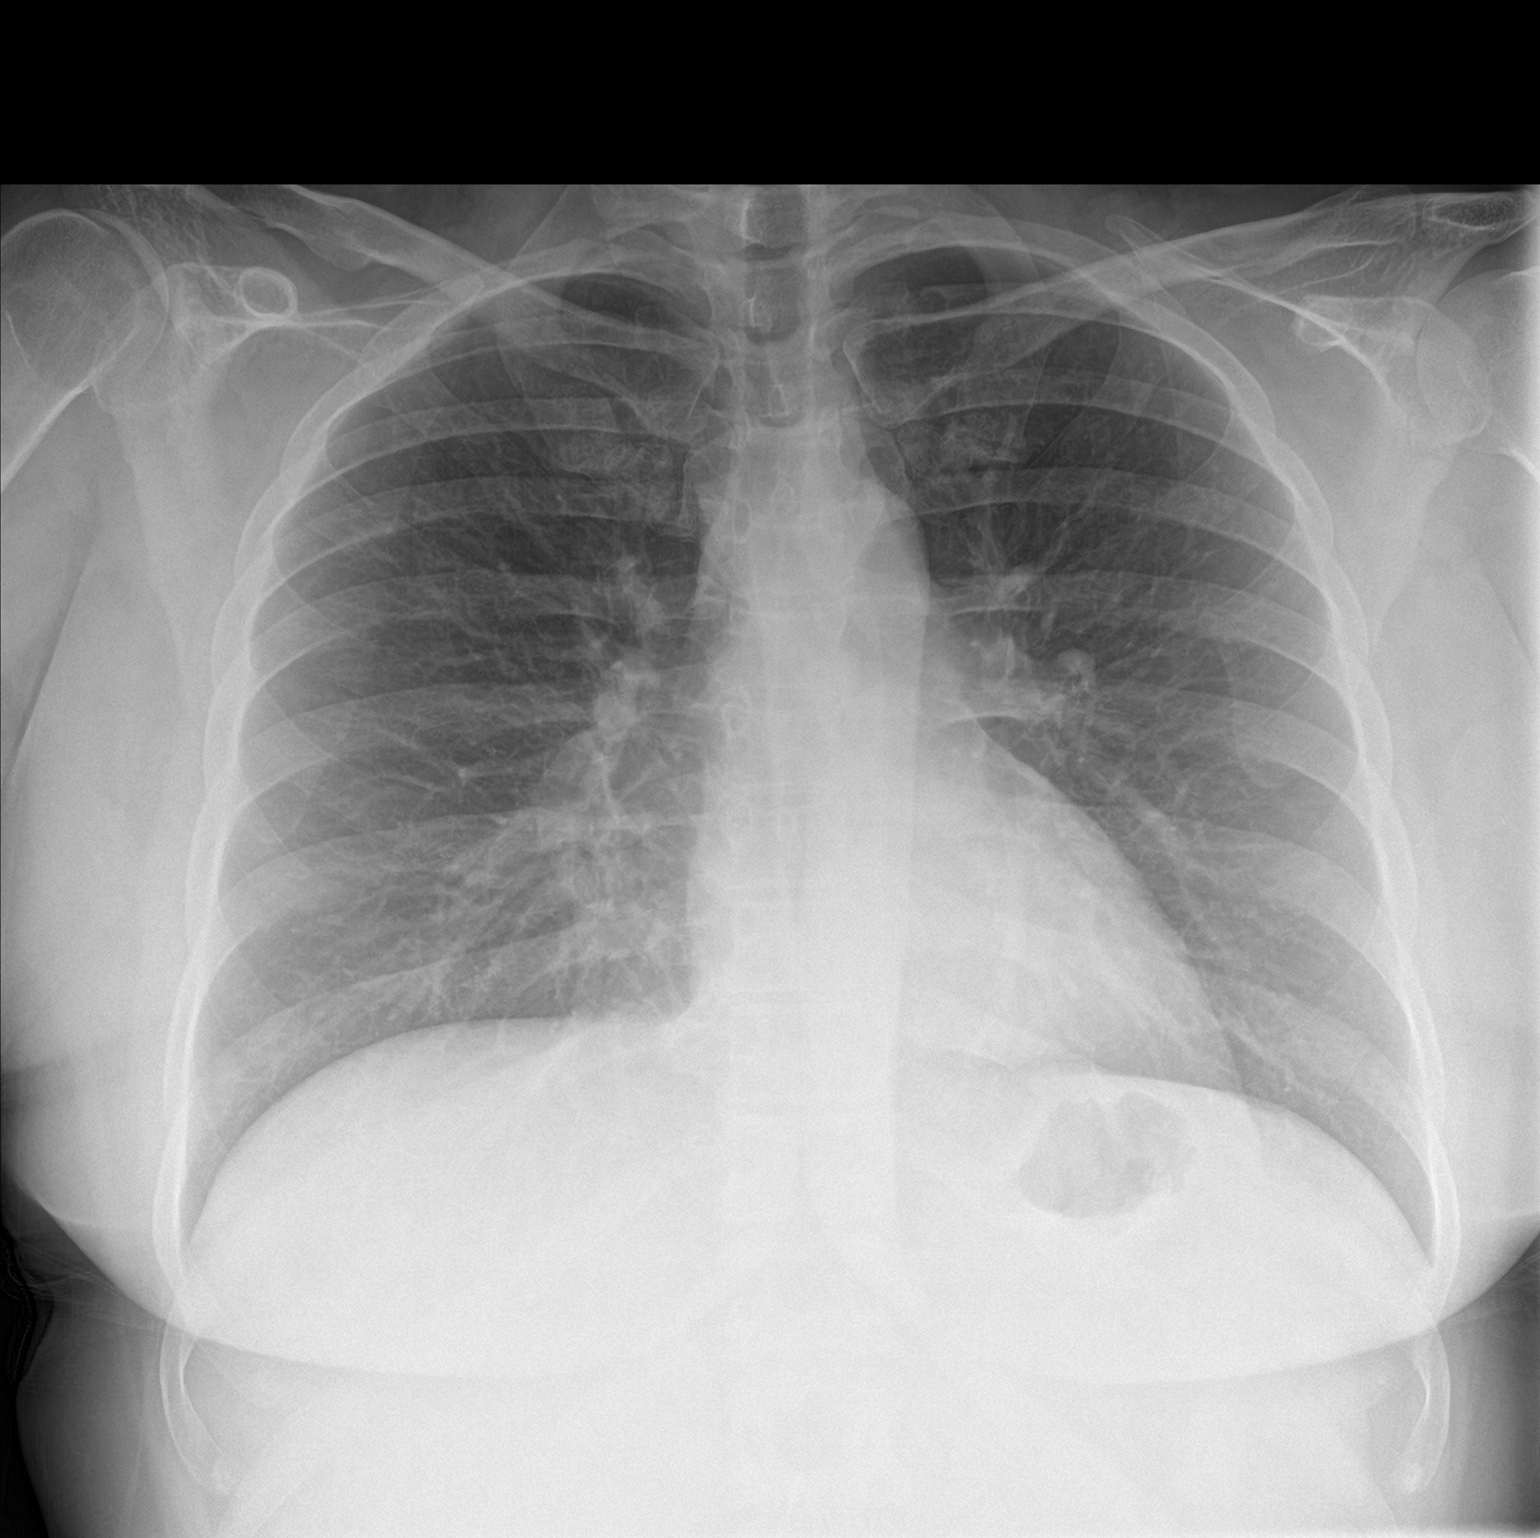

[chest lat]
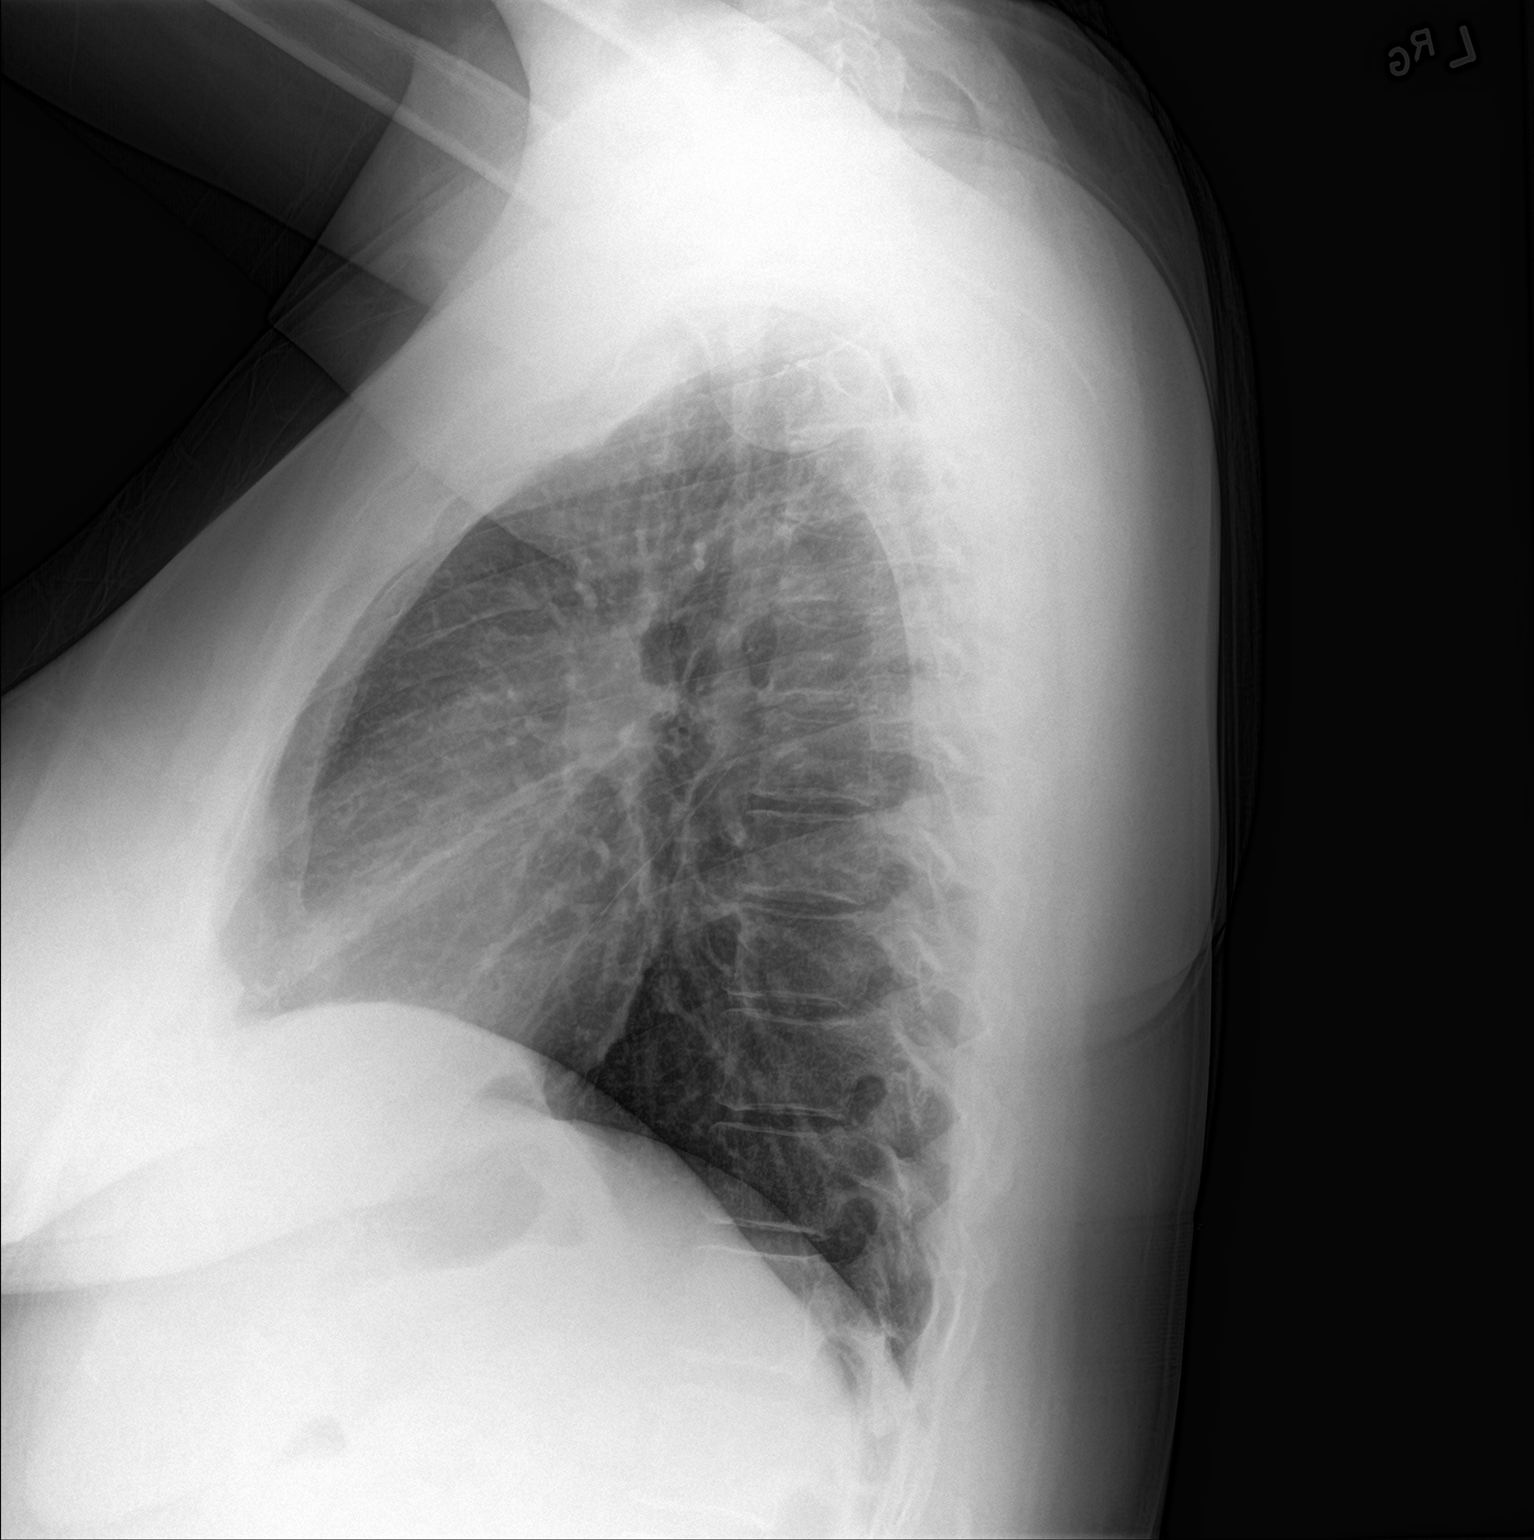

[2 of 2 positions shown; findings below may reference images not displayed]

FINDINGS: Normal sized heart. Clear lungs with normal vascularity. Minimal
thoracic spine degenerative changes.
IMPRESSION: No acute abnormality.

## 2019-02-26 IMAGING — RF DG CHOLANGIOGRAM OPERATIVE
1 series · 4 of 4 positions shown · non-contrast
Comparison: Right upper quadrant abdominal ultrasound - 07/01/2018

CLINICAL DATA: Intraoperative cholangiogram during laparoscopic
cholecystectomy.

EXAM:
INTRAOPERATIVE CHOLANGIOGRAM
FLUOROSCOPY TIME:  15 seconds

[Series 1: run · 4 of 95 frames shown]
[frame 15/95]
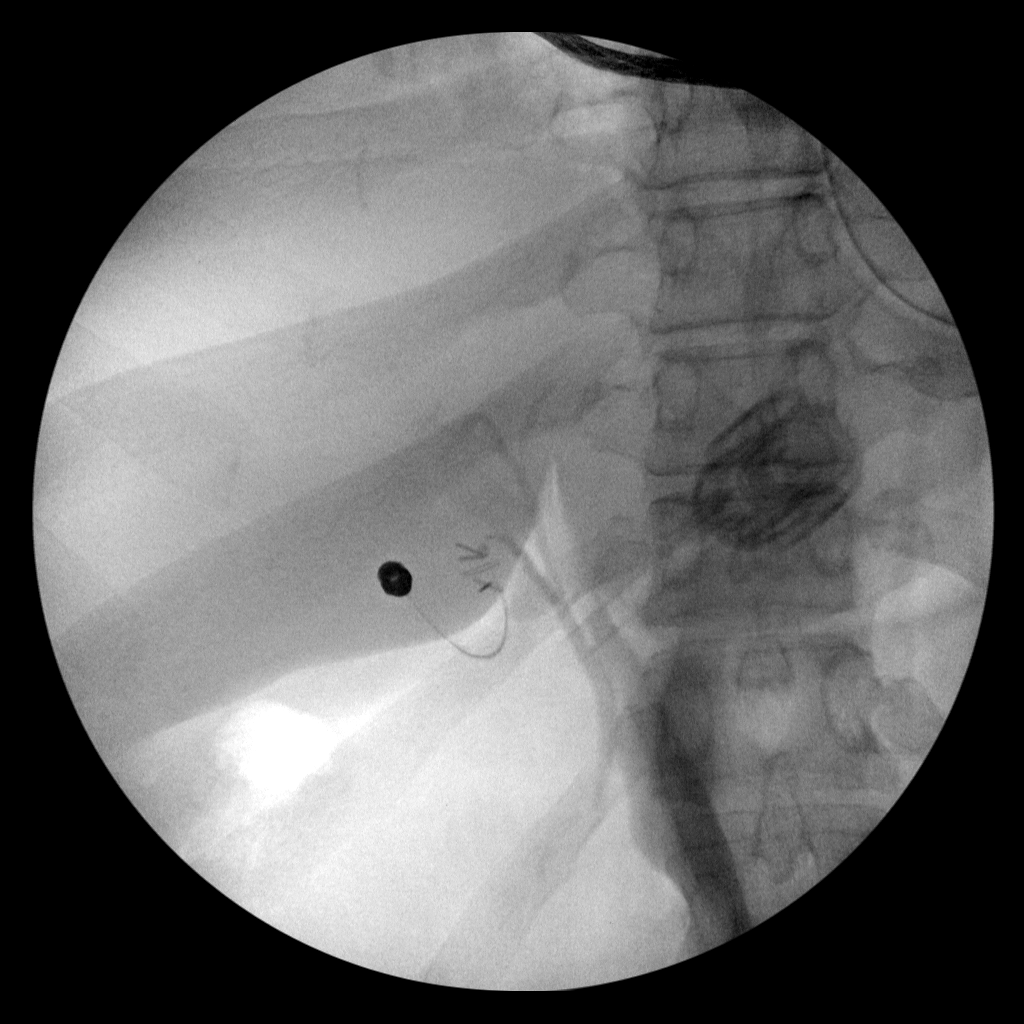
[frame 48/95]
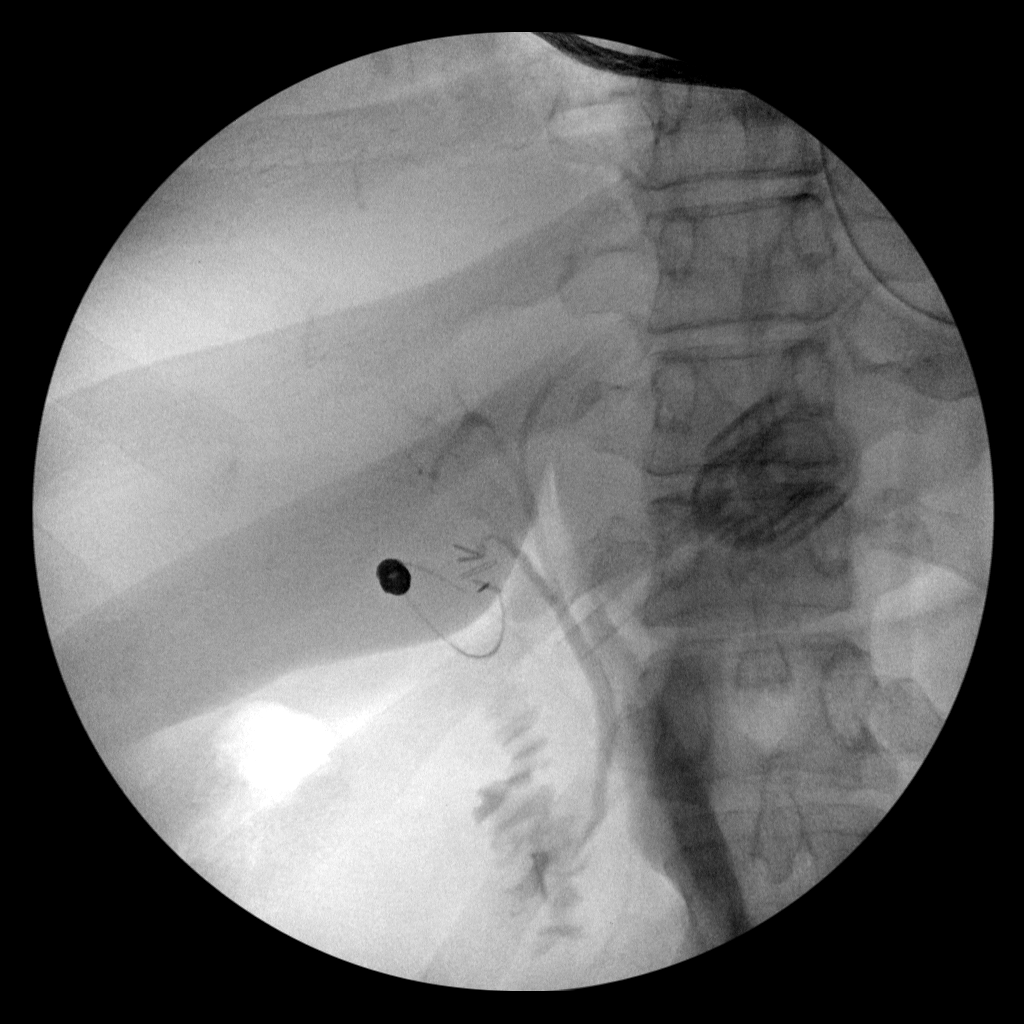
[frame 81/95]
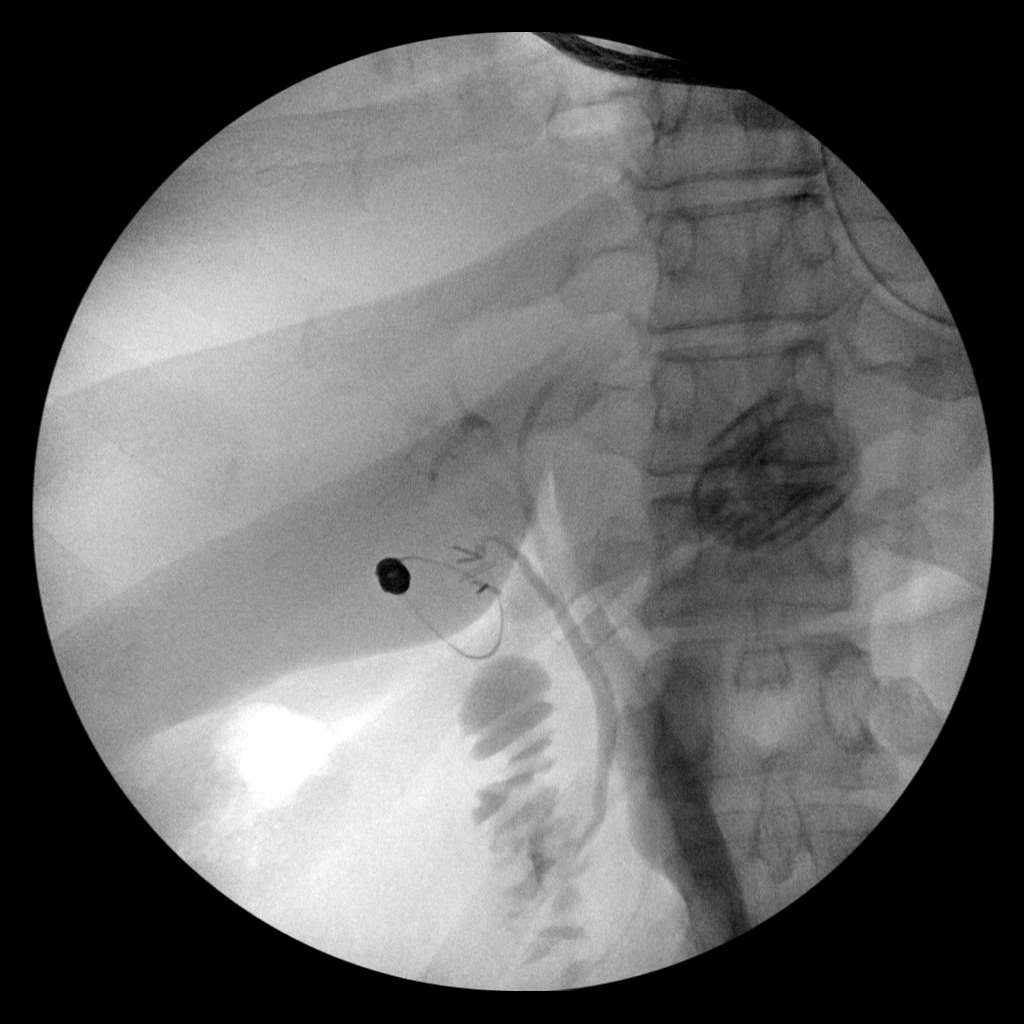
[frame 93/95]
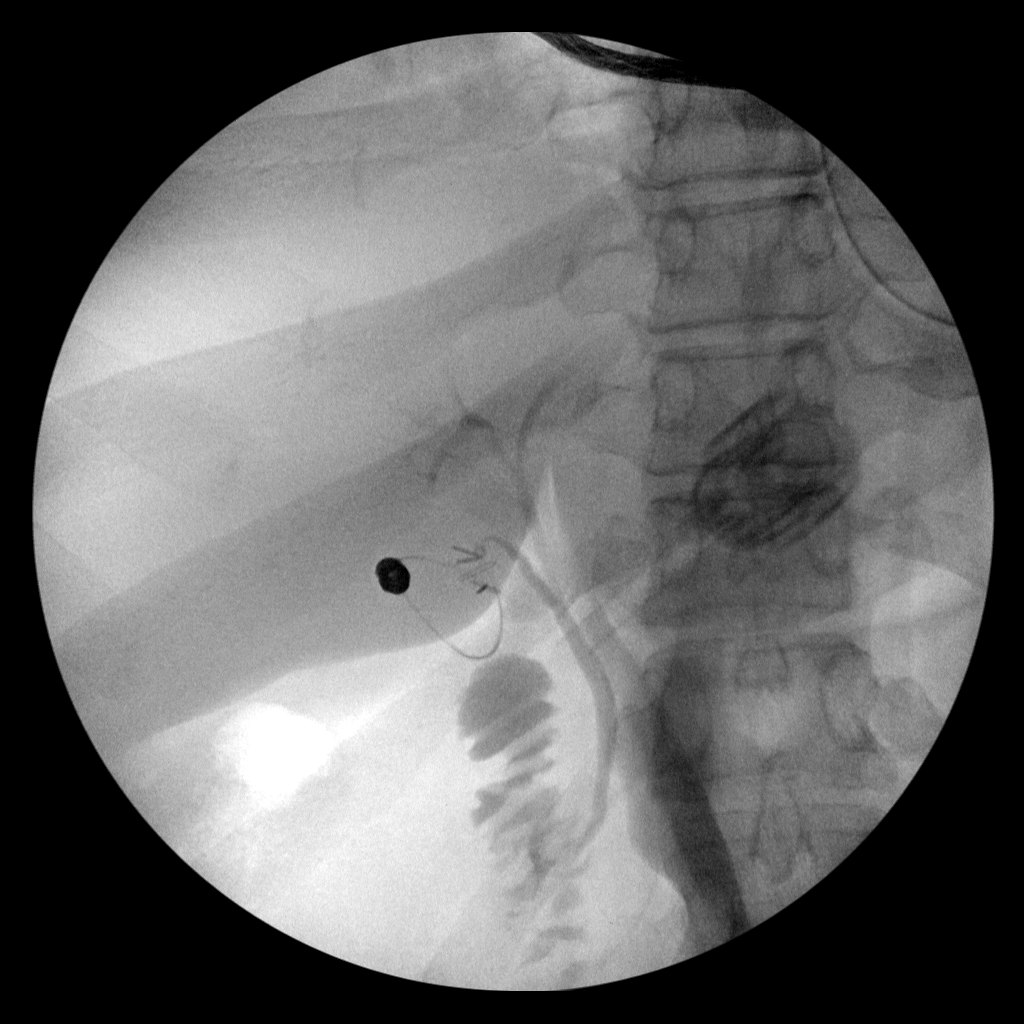

[4 of 4 positions shown; findings below may reference images not displayed]

FINDINGS: Intraoperative cholangiographic images of the right upper abdominal
quadrant during laparoscopic cholecystectomy are provided for
review.

Surgical clips overlie the expected location of the gallbladder
fossa.

Contrast injection demonstrates selective cannulation of the central
aspect of the cystic duct.

There is passage of contrast through the central aspect of the
cystic duct with filling of a non dilated common bile duct. There is
passage of contrast though the CBD and into the descending portion
of the duodenum.

There is minimal reflux of injected contrast into the common hepatic
duct and central aspect of the non dilated intrahepatic biliary
system.

There are no discrete filling defects within the opacified portions
of the biliary system to suggest the presence of
choledocholithiasis.
IMPRESSION: No evidence of choledocholithiasis.

## 2019-03-29 ENCOUNTER — Other Ambulatory Visit: Payer: Self-pay | Admitting: Family Medicine

## 2019-03-29 DIAGNOSIS — F411 Generalized anxiety disorder: Secondary | ICD-10-CM

## 2019-04-26 ENCOUNTER — Encounter: Payer: Self-pay | Admitting: Family Medicine

## 2019-04-26 DIAGNOSIS — F411 Generalized anxiety disorder: Secondary | ICD-10-CM

## 2019-04-26 MED ORDER — HYDROXYZINE HCL 25 MG PO TABS: ORAL_TABLET | ORAL | 0 refills | Status: DC

## 2019-04-26 MED ORDER — AMLODIPINE BESYLATE 5 MG PO TABS: 5.0000 mg | ORAL_TABLET | Freq: Every day | ORAL | 0 refills | Status: DC

## 2019-04-26 NOTE — Addendum Note (Signed)
Addended by: Scharlene Gloss B on: 04/26/2019 01:22 PM   Modules accepted: Orders

## 2019-06-05 ENCOUNTER — Other Ambulatory Visit: Payer: Self-pay | Admitting: Family Medicine

## 2019-06-05 DIAGNOSIS — F411 Generalized anxiety disorder: Secondary | ICD-10-CM

## 2019-06-05 MED ORDER — HYDROXYZINE HCL 25 MG PO TABS: ORAL_TABLET | ORAL | 0 refills | Status: DC

## 2019-06-05 MED ORDER — AMLODIPINE BESYLATE 5 MG PO TABS: 5.0000 mg | ORAL_TABLET | Freq: Every day | ORAL | 0 refills | Status: DC

## 2019-06-06 ENCOUNTER — Other Ambulatory Visit: Payer: Self-pay | Admitting: Family Medicine

## 2019-06-06 MED ORDER — AMLODIPINE BESYLATE 5 MG PO TABS: 5.0000 mg | ORAL_TABLET | Freq: Every day | ORAL | 0 refills | Status: DC

## 2019-06-28 ENCOUNTER — Other Ambulatory Visit: Payer: Self-pay | Admitting: Family Medicine

## 2019-06-28 ENCOUNTER — Encounter: Payer: Self-pay | Admitting: Family Medicine

## 2019-06-28 MED ORDER — AMLODIPINE BESYLATE 5 MG PO TABS: 5.0000 mg | ORAL_TABLET | Freq: Every day | ORAL | 2 refills | Status: DC

## 2019-07-19 ENCOUNTER — Encounter: Payer: Self-pay | Admitting: Family Medicine

## 2019-07-19 DIAGNOSIS — F411 Generalized anxiety disorder: Secondary | ICD-10-CM

## 2019-07-19 MED ORDER — AMLODIPINE BESYLATE 5 MG PO TABS: 5.0000 mg | ORAL_TABLET | Freq: Every day | ORAL | 2 refills | Status: DC

## 2019-07-19 MED ORDER — HYDROXYZINE HCL 25 MG PO TABS: ORAL_TABLET | ORAL | 0 refills | Status: DC

## 2019-08-23 ENCOUNTER — Encounter: Payer: Self-pay | Admitting: Family Medicine

## 2019-08-23 DIAGNOSIS — F411 Generalized anxiety disorder: Secondary | ICD-10-CM

## 2019-08-24 MED ORDER — AMLODIPINE BESYLATE 5 MG PO TABS: 5.0000 mg | ORAL_TABLET | Freq: Every day | ORAL | 1 refills | Status: DC

## 2019-08-24 MED ORDER — HYDROXYZINE HCL 25 MG PO TABS: ORAL_TABLET | ORAL | 0 refills | Status: DC

## 2019-09-28 ENCOUNTER — Other Ambulatory Visit: Payer: Self-pay | Admitting: Family Medicine

## 2019-09-28 ENCOUNTER — Encounter: Payer: Self-pay | Admitting: Family Medicine

## 2019-09-28 DIAGNOSIS — F411 Generalized anxiety disorder: Secondary | ICD-10-CM

## 2019-09-28 MED ORDER — HYDROXYZINE HCL 25 MG PO TABS: ORAL_TABLET | ORAL | 2 refills | Status: DC

## 2019-10-15 ENCOUNTER — Encounter: Payer: Self-pay | Admitting: Family Medicine

## 2019-10-15 MED ORDER — AMLODIPINE BESYLATE 5 MG PO TABS: 5.0000 mg | ORAL_TABLET | Freq: Every day | ORAL | 3 refills | Status: DC

## 2020-01-20 ENCOUNTER — Encounter: Payer: Self-pay | Admitting: Family Medicine

## 2020-01-20 DIAGNOSIS — R739 Hyperglycemia, unspecified: Secondary | ICD-10-CM

## 2020-01-21 ENCOUNTER — Telehealth: Payer: Self-pay

## 2020-01-21 ENCOUNTER — Encounter: Payer: Self-pay | Admitting: Family

## 2020-01-21 ENCOUNTER — Ambulatory Visit (INDEPENDENT_AMBULATORY_CARE_PROVIDER_SITE_OTHER): Payer: Medicaid Other | Admitting: Family

## 2020-01-21 DIAGNOSIS — M79673 Pain in unspecified foot: Secondary | ICD-10-CM | POA: Diagnosis not present

## 2020-01-21 DIAGNOSIS — R202 Paresthesia of skin: Secondary | ICD-10-CM

## 2020-01-21 MED ORDER — GABAPENTIN 100 MG PO CAPS: 100.0000 mg | ORAL_CAPSULE | Freq: Three times a day (TID) | ORAL | 0 refills | Status: DC

## 2020-01-21 NOTE — Progress Notes (Signed)
Virtual Visit via Video Note  I connected with Julie Kennedy on 01/21/20 at 10:40 AM EST by a video enabled telemedicine application and verified that I am speaking with the correct person using two identifiers.  Location: Patient: home Provider: work   I discussed the limitations of evaluation and management by telemedicine and the availability of in person appointments. The patient expressed understanding and agreed to proceed.  History of Present Illness:  Pt reports that she had covid symptoms a few weeks ago  Got covid tested.  Reports bilateral foot pain ankle to toes on both feet, describes a burning pain. Hurts to walk, wear shoes.  Reports pain started 1 week ago.  Having trouble sleeping due to the pain. She reports that both feet are "very sensitive"  Reports that she feels like her feet are being colder.  She denies low back pain.  Denies redness or swelling in her feet.  Reports that all of her covid symptoms have resolved.  Denies bowel/bladder incontinence.     Past Medical History:  Diagnosis Date  . Anxiety   . Depression   . Frequent headaches   . Hard of hearing   . Hypertension      Social History   Socioeconomic History  . Marital status: Single    Spouse name: Not on file  . Number of children: Not on file  . Years of education: Not on file  . Highest education level: Not on file  Occupational History  . Not on file  Tobacco Use  . Smoking status: Never Smoker  . Smokeless tobacco: Never Used  Substance and Sexual Activity  . Alcohol use: Yes    Comment: occ  . Drug use: Not Currently    Comment: pt denies a history, but cocaine listed from previous visit   . Sexual activity: Not Currently  Other Topics Concern  . Not on file  Social History Narrative  . Not on file   Social Determinants of Health   Financial Resource Strain:   . Difficulty of Paying Living Expenses: Not on file  Food Insecurity:   . Worried About Charity fundraiser in  the Last Year: Not on file  . Ran Out of Food in the Last Year: Not on file  Transportation Needs:   . Lack of Transportation (Medical): Not on file  . Lack of Transportation (Non-Medical): Not on file  Physical Activity:   . Days of Exercise per Week: Not on file  . Minutes of Exercise per Session: Not on file  Stress:   . Feeling of Stress : Not on file  Social Connections:   . Frequency of Communication with Friends and Family: Not on file  . Frequency of Social Gatherings with Friends and Family: Not on file  . Attends Religious Services: Not on file  . Active Member of Clubs or Organizations: Not on file  . Attends Archivist Meetings: Not on file  . Marital Status: Not on file  Intimate Partner Violence:   . Fear of Current or Ex-Partner: Not on file  . Emotionally Abused: Not on file  . Physically Abused: Not on file  . Sexually Abused: Not on file    Past Surgical History:  Procedure Laterality Date  . CESAREAN SECTION    . CHOLECYSTECTOMY N/A 07/07/2018   Procedure: LAPAROSCOPIC CHOLECYSTECTOMY WITH INTRAOPERATIVE CHOLANGIOGRAM ERAS PATHWAY;  Surgeon: Jovita Kussmaul, MD;  Location: [facility];  Service: General;  Laterality: N/A;  . TONSILLECTOMY  AND ADENOIDECTOMY      Family History  Problem Relation Age of Onset  . Arthritis Mother   . Depression Mother   . Hypertension Mother     Allergies  Allergen Reactions  . Percocet [Oxycodone-Acetaminophen] Other (See Comments)    Hallucinations    Current Outpatient Medications on File Prior to Visit  Medication Sig Dispense Refill  . amLODipine (NORVASC) 5 MG tablet Take 1 tablet (5 mg total) by mouth daily. 30 tablet 3  . hydrOXYzine (ATARAX/VISTARIL) 25 MG tablet TAKE 1 TO 3 TABLETS(25 TO 75 MG) BY MOUTH THREE TIMES DAILY AS NEEDED FOR ANXIETY 90 tablet 2  . norgestimate-ethinyl estradiol (ORTHO-CYCLEN,SPRINTEC,PREVIFEM) 0.25-35 MG-MCG tablet Take 1 tablet by mouth daily.    . sertraline (ZOLOFT) 50 MG  tablet Take 50 mg by mouth daily.     No current facility-administered medications on file prior to visit.    There were no vitals taken for this visit.   Observations/Objective:   Gen: Awake, alert, no acute distress Resp: Breathing is even and non-labored Psych: calm/pleasant demeanor Neuro: Alert and Oriented x 3, + facial symmetry, speech is clear.   Assessment and Plan:   Bilateral foot pain/paresthesia- etiology unclear. I think that she would benefit from a face to face visit.  I did offer her a trial of gabapentin for pain.  Will obtain baseline lab work including CMET, CBC, TSH, Magnesium, uric acid, RA, ESR. She is unable to come in today- she was scheduled for labs tomorrow. We will work on getting her scheduled for a face to face office visit this week.  Follow Up Instructions:    I discussed the assessment and treatment plan with the patient. The patient was provided an opportunity to ask questions and all were answered. The patient agreed with the plan and demonstrated an understanding of the instructions.   The patient was advised to call back or seek an in-person evaluation if the symptoms worsen or if the condition fails to improve as anticipated.  Nance Pear, NP

## 2020-01-21 NOTE — Telephone Encounter (Signed)
Patient called and schedule with [facility] today.

## 2020-01-21 NOTE — Telephone Encounter (Signed)
Melissa seen patient today virtually, she wants patient to come in the office to see Dr. Carmelia Roller. I called patient left voicemail for patient to call the office to schedule an in person visit

## 2020-01-22 ENCOUNTER — Other Ambulatory Visit: Payer: Self-pay

## 2020-01-22 ENCOUNTER — Telehealth: Payer: Self-pay | Admitting: Family

## 2020-01-22 ENCOUNTER — Telehealth: Payer: Self-pay | Admitting: *Deleted

## 2020-01-22 ENCOUNTER — Other Ambulatory Visit (INDEPENDENT_AMBULATORY_CARE_PROVIDER_SITE_OTHER): Payer: Medicaid Other

## 2020-01-22 DIAGNOSIS — D696 Thrombocytopenia, unspecified: Secondary | ICD-10-CM

## 2020-01-22 DIAGNOSIS — E876 Hypokalemia: Secondary | ICD-10-CM

## 2020-01-22 DIAGNOSIS — R739 Hyperglycemia, unspecified: Secondary | ICD-10-CM | POA: Diagnosis not present

## 2020-01-22 DIAGNOSIS — R202 Paresthesia of skin: Secondary | ICD-10-CM

## 2020-01-22 DIAGNOSIS — M79673 Pain in unspecified foot: Secondary | ICD-10-CM | POA: Diagnosis not present

## 2020-01-22 LAB — CBC WITH DIFFERENTIAL/PLATELET
Basophils Absolute: 0 10*3/uL (ref 0.0–0.1)
Basophils Relative: 0.2 % (ref 0.0–3.0)
Eosinophils Absolute: 0 10*3/uL (ref 0.0–0.7)
Eosinophils Relative: 0.4 % (ref 0.0–5.0)
HCT: 27.8 % — ABNORMAL LOW (ref 36.0–46.0)
Hemoglobin: 9.3 g/dL — ABNORMAL LOW (ref 12.0–15.0)
Lymphocytes Relative: 23 % (ref 12.0–46.0)
Lymphs Abs: 0.9 10*3/uL (ref 0.7–4.0)
MCHC: 33.3 g/dL (ref 30.0–36.0)
MCV: 87.9 fl (ref 78.0–100.0)
Monocytes Absolute: 0.2 10*3/uL (ref 0.1–1.0)
Monocytes Relative: 4.2 % (ref 3.0–12.0)
Neutro Abs: 2.9 10*3/uL (ref 1.4–7.7)
Neutrophils Relative %: 72.2 % (ref 43.0–77.0)
Platelets: 39 10*3/uL — CL (ref 150.0–400.0)
RBC: 3.17 Mil/uL — ABNORMAL LOW (ref 3.87–5.11)
RDW: 21.4 % — ABNORMAL HIGH (ref 11.5–15.5)
WBC: 4.1 10*3/uL (ref 4.0–10.5)

## 2020-01-22 LAB — COMPREHENSIVE METABOLIC PANEL
ALT: 58 U/L — ABNORMAL HIGH (ref 0–35)
AST: 190 U/L — ABNORMAL HIGH (ref 0–37)
Albumin: 3.7 g/dL (ref 3.5–5.2)
Alkaline Phosphatase: 115 U/L (ref 39–117)
BUN: 7 mg/dL (ref 6–23)
CO2: 31 mEq/L (ref 19–32)
Calcium: 9 mg/dL (ref 8.4–10.5)
Chloride: 94 mEq/L — ABNORMAL LOW (ref 96–112)
Creatinine, Ser: 0.42 mg/dL (ref 0.40–1.20)
GFR: 175.09 mL/min (ref >60.00)
Glucose, Bld: 105 mg/dL — ABNORMAL HIGH (ref 70–99)
Potassium: 2.8 mEq/L — CL (ref 3.5–5.1)
Sodium: 135 mEq/L (ref 135–145)
Total Bilirubin: 1.3 mg/dL — ABNORMAL HIGH (ref 0.2–1.2)
Total Protein: 6.3 g/dL (ref 6.0–8.3)

## 2020-01-22 LAB — SEDIMENTATION RATE: Sed Rate: 6 mm/hr (ref 0–20)

## 2020-01-22 LAB — HEMOGLOBIN A1C: Hgb A1c MFr Bld: 5.1 % (ref 4.6–6.5)

## 2020-01-22 LAB — URIC ACID: Uric Acid, Serum: 4.8 mg/dL (ref 2.4–7.0)

## 2020-01-22 LAB — MAGNESIUM: Magnesium: 1.1 mg/dL — ABNORMAL LOW (ref 1.5–2.5)

## 2020-01-22 LAB — TSH: TSH: 3.27 u[IU]/mL (ref 0.35–4.50)

## 2020-01-22 LAB — VITAMIN B12: Vitamin B-12: 194 pg/mL — ABNORMAL LOW (ref 211–911)

## 2020-01-22 MED ORDER — MAGNESIUM OXIDE -MG SUPPLEMENT 400 (240 MG) MG PO TABS: 1.0000 | ORAL_TABLET | Freq: Two times a day (BID) | ORAL | 0 refills | Status: DC

## 2020-01-22 MED ORDER — POTASSIUM CHLORIDE CRYS ER 20 MEQ PO TBCR: EXTENDED_RELEASE_TABLET | ORAL | 0 refills | Status: DC

## 2020-01-22 NOTE — Telephone Encounter (Signed)
Addendum:  Reviewed newly resulted labs- normocytic anemia, b12 deficiency, abnormal LFT's, hypokalemia/hypomagnesemia. These findings and below plan were reviewed with patient.   Plan as follows:  (case reviewed with Dr. Abner Greenspan)  Kdur PO now and again in AM.  Repeat BMET tomorrow afternoon. MagOx 400mg  bid- continue until follow up with Dr. on Friday. Begin B12 injections when she comes in for her visit with Dr. Tuesday on Friday.   She reports that she was abusing alcohol during the pandemic but has not had a drink in 1 week and "has it under control." Advised pt to abstain from alcohol.

## 2020-01-22 NOTE — Telephone Encounter (Signed)
Spoke to pt. And informed her about her low platelet count (36K). She denies any active signs of bleeding.  Advised pt to go to the ER if she develops black/bloody stools, nosebleed, hematuria.  Will arrange an appointment with hematology for consultation. Will await pending labs. Pt is advised to keep her appointment with Dr. Carmelia Roller on 01/25/20.   Dr. Carmelia Roller- FYI.

## 2020-01-22 NOTE — Telephone Encounter (Signed)
Please contact pt to schedule a lab visit in the afternoon 3/3 (Wednesday)

## 2020-01-22 NOTE — Telephone Encounter (Signed)
CRITICAL VALUE STICKER  CRITICAL VALUE: PLATELET COUNT [id number]  MESSENGER (representative from lab): Clydie Braun  Clydie Braun is in the process of getting labs into the computer.  FYI:  Patient does have an appointment with Dr. Carmelia Roller on 01/25/20

## 2020-01-22 NOTE — Telephone Encounter (Signed)
I did put a note on Lab appt to have pt schedule. I will try to catch her when she comes in.

## 2020-01-22 NOTE — Telephone Encounter (Signed)
Pt has been scheduled for Friday with PCP.

## 2020-01-22 NOTE — Addendum Note (Signed)
Addended by: Sandford Craze on: 01/22/2020 06:34 PM   Modules accepted: Orders

## 2020-01-23 ENCOUNTER — Other Ambulatory Visit: Payer: Medicaid Other

## 2020-01-23 LAB — ANA: Anti Nuclear Antibody (ANA): NEGATIVE

## 2020-01-23 LAB — RHEUMATOID FACTOR: Rheumatoid fact SerPl-aCnc: <14 IU/mL (ref <14)

## 2020-01-23 NOTE — Telephone Encounter (Signed)
Patient was instructed to take 20 mg of kdur in am tomorrow, she verbalized understanding and will be here tomorrow pm for labs.

## 2020-01-23 NOTE — Telephone Encounter (Signed)
[  9:25 AM] Sima Matas     Patient that needs appointment for labs today called earlier and was scheduled for tomorrow pm,due to no slots available in our office I told her she can go to [place] but she said she is in  and hard to get to [facility].  Can she wait until tomorrow or do I tell her she needs the labs toda?  ?[9:26 AM] Sandford Craze     She can wait till tomorrow but should take kdur 20 meq tomorrow AM before test.

## 2020-01-23 NOTE — Telephone Encounter (Signed)
Opened in error

## 2020-01-24 ENCOUNTER — Other Ambulatory Visit: Payer: Self-pay

## 2020-01-24 ENCOUNTER — Other Ambulatory Visit (INDEPENDENT_AMBULATORY_CARE_PROVIDER_SITE_OTHER): Payer: Medicaid Other

## 2020-01-24 DIAGNOSIS — E876 Hypokalemia: Secondary | ICD-10-CM | POA: Diagnosis not present

## 2020-01-25 ENCOUNTER — Other Ambulatory Visit: Payer: Self-pay

## 2020-01-25 ENCOUNTER — Ambulatory Visit (INDEPENDENT_AMBULATORY_CARE_PROVIDER_SITE_OTHER): Payer: Medicaid Other | Admitting: Family Medicine

## 2020-01-25 ENCOUNTER — Other Ambulatory Visit: Payer: Self-pay | Admitting: Family Medicine

## 2020-01-25 ENCOUNTER — Encounter: Payer: Self-pay | Admitting: Family Medicine

## 2020-01-25 VITALS — BP 112/72 | HR 111 | Temp 96.9°F | Ht 68.0 in | Wt 236.1 lb

## 2020-01-25 DIAGNOSIS — F411 Generalized anxiety disorder: Secondary | ICD-10-CM

## 2020-01-25 DIAGNOSIS — R202 Paresthesia of skin: Secondary | ICD-10-CM | POA: Diagnosis not present

## 2020-01-25 DIAGNOSIS — E876 Hypokalemia: Secondary | ICD-10-CM

## 2020-01-25 DIAGNOSIS — N939 Abnormal uterine and vaginal bleeding, unspecified: Secondary | ICD-10-CM | POA: Diagnosis not present

## 2020-01-25 DIAGNOSIS — E538 Deficiency of other specified B group vitamins: Secondary | ICD-10-CM

## 2020-01-25 DIAGNOSIS — I10 Essential (primary) hypertension: Secondary | ICD-10-CM | POA: Diagnosis not present

## 2020-01-25 LAB — MAGNESIUM: Magnesium: 1.5 mg/dL (ref 1.5–2.5)

## 2020-01-25 LAB — BASIC METABOLIC PANEL
BUN: 5 mg/dL — ABNORMAL LOW (ref 6–23)
CO2: 28 mEq/L (ref 19–32)
Calcium: 8.8 mg/dL (ref 8.4–10.5)
Chloride: 100 mEq/L (ref 96–112)
Creatinine, Ser: 0.48 mg/dL (ref 0.40–1.20)
GFR: 150.07 mL/min (ref >60.00)
Glucose, Bld: 98 mg/dL (ref 70–99)
Potassium: 3.4 mEq/L — ABNORMAL LOW (ref 3.5–5.1)
Sodium: 135 mEq/L (ref 135–145)

## 2020-01-25 MED ORDER — HYDROXYZINE HCL 25 MG PO TABS: ORAL_TABLET | ORAL | 2 refills | Status: DC

## 2020-01-25 MED ORDER — POTASSIUM CHLORIDE CRYS ER 20 MEQ PO TBCR: EXTENDED_RELEASE_TABLET | ORAL | 0 refills | Status: DC

## 2020-01-25 MED ORDER — AMLODIPINE BESYLATE 5 MG PO TABS: 5.0000 mg | ORAL_TABLET | Freq: Every day | ORAL | 2 refills | Status: DC

## 2020-01-25 MED ORDER — NORETHINDRONE 0.35 MG PO TABS: 1.0000 | ORAL_TABLET | Freq: Every day | ORAL | 11 refills | Status: DC

## 2020-01-25 MED ORDER — VENLAFAXINE HCL ER 37.5 MG PO CP24: 37.5000 mg | ORAL_CAPSULE | Freq: Every day | ORAL | 1 refills | Status: DC

## 2020-01-25 MED ORDER — MAGNESIUM OXIDE -MG SUPPLEMENT 400 (240 MG) MG PO TABS: 1.0000 | ORAL_TABLET | Freq: Every day | ORAL | 0 refills | Status: DC

## 2020-01-25 NOTE — Patient Instructions (Addendum)
Keep the diet clean and stay active.  Call Center for [facility] at [facility] at [phone] for an appointment.  They are located at [address], [place], [place], [address] (right across the hall from our office).  Take B12 daily.   Let us know if you need anything.

## 2020-01-25 NOTE — Progress Notes (Signed)
Chief Complaint  Patient presents with  . Follow-up    Subjective: Patient is a 34 y.o. female here for f/u.  Seen a few days ago for paresthesias, found to have low Mg, K and B12. Started taking PO supps for each. Feels a little etter. Gabapentin was slightly helpful, but she had bad AE's with it so stopped. Would like something else.  Hx of anemia and heavy bleeding. OCPs increased her BP. Has never been on progesterone only pill. Needs GYN. No current pain.   BP controlled with Norvasc, needs refill. No AE's, reports compliance. Diet fair overall. Lifts weights and does cardio, BF is active which helps her.    ROS: Heart: Denies chest pain  Lungs: Denies SOB   Past Medical History:  Diagnosis Date  . Anxiety   . Depression   . Frequent headaches   . Hard of hearing   . Hypertension     Objective: BP 112/72 (BP Location: Left Arm, Patient Position: Sitting, Cuff Size: Large)   Pulse (!) 111   Temp (!) 96.9 F (36.1 C) (Temporal)   Ht 5\' 8"  (1.727 m)   Wt 236 lb 2 oz (107.1 kg)   SpO2 94%   BMI 35.90 kg/m  General: Awake, appears stated age HEENT: MMM, EOMi Heart: RRR, no murmurs Lungs: CTAB, no rales, wheezes or rhonchi. No accessory muscle use Neuro: Sensation intact to pinprick b/l.  Psych: Age appropriate judgment and insight, normal affect and mood  Assessment and Plan: Abnormal uterine bleeding (AUB) - Plan: norethindrone (MICRONOR) 0.35 MG tablet  Essential hypertension - Plan: amLODipine (NORVASC) 5 MG tablet  GAD (generalized anxiety disorder) - Plan: hydrOXYzine (ATARAX/VISTARIL) 25 MG tablet, DISCONTINUED: hydrOXYzine (ATARAX/VISTARIL) 25 MG tablet  Paresthesias - Plan: venlafaxine XR (EFFEXOR-XR) 37.5 MG 24 hr capsule  Hypomagnesemia  Hypokalemia  B12 deficiency - Plan: B12  1- GYN info given. Start Micronor.  2- Cont Norvasc. Counseled on diet and exercise. 3- Cont Atarax. 4- Trial SNRI.  5-7- PO replacement, if no improvement in 6 weeks  will ck IF.  I will see her in 6 mo or prn.  The patient voiced understanding and agreement to the plan.  [place], DO 01/25/20  11:24 AM

## 2020-01-28 ENCOUNTER — Other Ambulatory Visit: Payer: Self-pay | Admitting: Hematology

## 2020-01-28 DIAGNOSIS — D649 Anemia, unspecified: Secondary | ICD-10-CM

## 2020-01-28 DIAGNOSIS — D696 Thrombocytopenia, unspecified: Secondary | ICD-10-CM

## 2020-01-28 NOTE — Progress Notes (Deleted)
[name] NOTE  Patient Care Team: Shelda Pal, DO as PCP - General (Family Medicine)  HEME/ONC OVERVIEW: 1. Thrombocytopenia  ASSESSMENT & PLAN:   Thrombocytopenia -I reviewed the patient's records in detail, including PCP clinic notes and lab results -In summary, patient was seen by her PCP via telemedicine for persistent paresthesias in the extremities, and labs were notable for multiple abnormalities, including hypokalemia, hypomagnesemia, elevated LFTs and mild hyperbilirubinemia, and to be due to alcohol abuse during the pandemic.  In addition, CBC showed worsening normocytic anemia (Hgb 9.7) and new onset thrombocytopenia (plts 31k).  B12 level was severely low at 194.  Patient was referred to hematology for further evaluation. -I reviewed the lab results in detail with the patient -Plts ___ today -I personally reviewed the patient's peripheral blood smear today.  __ -I discussed with the patient some of the common causes of thrombocytopenia, including infection, liver disease, medications, alcohol abuse, immune thrombocytopenia, and less commonly, bone marrow disorders -Given the recent heavy EtOH abuse, I suspect liver disease is contributing to the thrombocytopenia -I have ordered HIV, Hep B/C serologies, PT/PTT, and abdominal ultrasound to assess for any liver disease  Normocytic anemia -Likely due to nutritional deficiency associated with heavy EtOH abuse -B12 severely low at 194 -I have ordered iron profile and folate level -I recommend starting OTC B12 1046mcg daily, and daily multivitamin -If nutritional studies show additional deficiencies, we will to replete them as needed  No orders of the defined types were placed in this encounter.   The total time spent in the encounter was {CHL ONC TIME VISIT - OIZTI:[id number]}, including face-to-face time with the patient, review of various tests results, order additional studies/medications,  documentation, and coordination of care plan.   All questions were answered. The patient knows to call the clinic with any problems, questions or concerns. No barriers to learning was detected.  Tish Men, MD 3/8/202111:00 AM  CHIEF COMPLAINTS/PURPOSE OF CONSULTATION:  "I am here for ***"  HISTORY OF PRESENTING ILLNESS:  Julie Kennedy 34 y.o. female is here because of new incidental thrombocytopenia.  Patient was seen by her PCP via telemedicine for persistent paresthesias in the extremities, and labs were notable for multiple abnormalities, including hypokalemia, hypomagnesemia, elevated LFTs and mild hyperbilirubinemia, and to be due to alcohol abuse during the pandemic.  In addition, CBC showed worsening normocytic anemia (Hgb 9.7) and new onset thrombocytopenia (plts 31k).  B12 level was severely low at 194.  Patient was referred to hematology for further evaluation.  REVIEW OF SYSTEMS:   Constitutional: ( - ) fevers, ( - )  chills , ( - ) night sweats Eyes: ( - ) blurriness of vision, ( - ) double vision, ( - ) watery eyes Ears, nose, mouth, throat, and face: ( - ) mucositis, ( - ) sore throat Respiratory: ( - ) cough, ( - ) dyspnea, ( - ) wheezes Cardiovascular: ( - ) palpitation, ( - ) chest discomfort, ( - ) lower extremity swelling Gastrointestinal:  ( - ) nausea, ( - ) heartburn, ( - ) change in bowel habits Skin: ( - ) abnormal skin rashes Lymphatics: ( - ) new lymphadenopathy, ( - ) easy bruising Neurological: ( - ) numbness, ( - ) tingling, ( - ) new weaknesses Behavioral/Psych: ( - ) mood change, ( - ) new changes  All other systems were reviewed with the patient and are negative.  I have reviewed her chart and materials related to  her cancer extensively and collaborated history with the patient. Summary of oncologic history is as follows: Oncology History   No history exists.    MEDICAL HISTORY:  Past Medical History:  Diagnosis Date  . Anxiety   . Depression   .  Frequent headaches   . Hard of hearing   . Hypertension     SURGICAL HISTORY: Past Surgical History:  Procedure Laterality Date  . CESAREAN SECTION    . CHOLECYSTECTOMY N/A 07/07/2018   Procedure: LAPAROSCOPIC CHOLECYSTECTOMY WITH INTRAOPERATIVE CHOLANGIOGRAM ERAS PATHWAY;  Surgeon: Griselda Miner, MD;  Location: [facility];  Service: General;  Laterality: N/A;  . TONSILLECTOMY AND ADENOIDECTOMY      SOCIAL HISTORY: Social History   Socioeconomic History  . Marital status: Single    Spouse name: Not on file  . Number of children: Not on file  . Years of education: Not on file  . Highest education level: Not on file  Occupational History  . Not on file  Tobacco Use  . Smoking status: Never Smoker  . Smokeless tobacco: Never Used  Substance and Sexual Activity  . Alcohol use: Yes    Comment: occ  . Drug use: Not Currently    Comment: pt denies a history, but cocaine listed from previous visit   . Sexual activity: Not Currently  Other Topics Concern  . Not on file  Social History Narrative  . Not on file   Social Determinants of Health   Financial Resource Strain:   . Difficulty of Paying Living Expenses: Not on file  Food Insecurity:   . Worried About Programme researcher, broadcasting/film/video in the Last Year: Not on file  . Ran Out of Food in the Last Year: Not on file  Transportation Needs:   . Lack of Transportation (Medical): Not on file  . Lack of Transportation (Non-Medical): Not on file  Physical Activity:   . Days of Exercise per Week: Not on file  . Minutes of Exercise per Session: Not on file  Stress:   . Feeling of Stress : Not on file  Social Connections:   . Frequency of Communication with Friends and Family: Not on file  . Frequency of Social Gatherings with Friends and Family: Not on file  . Attends Religious Services: Not on file  . Active Member of Clubs or Organizations: Not on file  . Attends Banker Meetings: Not on file  . Marital Status: Not on file   Intimate Partner Violence:   . Fear of Current or Ex-Partner: Not on file  . Emotionally Abused: Not on file  . Physically Abused: Not on file  . Sexually Abused: Not on file    FAMILY HISTORY: Family History  Problem Relation Age of Onset  . Arthritis Mother   . Depression Mother   . Hypertension Mother     ALLERGIES:  is allergic to percocet [oxycodone-acetaminophen].  MEDICATIONS:  Current Outpatient Medications  Medication Sig Dispense Refill  . amLODipine (NORVASC) 5 MG tablet Take 1 tablet (5 mg total) by mouth daily. 90 tablet 2  . hydrOXYzine (ATARAX/VISTARIL) 25 MG tablet TAKE 1 TO 3 TABLETS(25 TO 75 MG) BY MOUTH THREE TIMES DAILY AS NEEDED FOR ANXIETY 90 tablet 2  . Magnesium Oxide 400 (240 Mg) MG TABS Take 1 tablet (400 mg total) by mouth daily. 30 tablet 0  . norethindrone (MICRONOR) 0.35 MG tablet Take 1 tablet (0.35 mg total) by mouth daily. 1 Package 11  . norgestimate-ethinyl estradiol (ORTHO-CYCLEN,SPRINTEC,PREVIFEM)  0.25-35 MG-MCG tablet Take 1 tablet by mouth daily.    . potassium chloride SA (KLOR-CON) 20 MEQ tablet Take 1 tab twice daily. 30 tablet 0  . sertraline (ZOLOFT) 50 MG tablet Take 50 mg by mouth daily.    Marland Kitchen venlafaxine XR (EFFEXOR-XR) 37.5 MG 24 hr capsule Take 1 capsule (37.5 mg total) by mouth daily with breakfast. 30 capsule 1   No current facility-administered medications for this visit.    PHYSICAL EXAMINATION: ECOG PERFORMANCE STATUS: {CHL ONC ECOG PS:[id number]}  There were no vitals filed for this visit. There were no vitals filed for this visit.  GENERAL: alert, no distress and comfortable SKIN: skin color, texture, turgor are normal, no rashes or significant lesions EYES: conjunctiva are pink and non-injected, sclera clear OROPHARYNX: no exudate, no erythema; lips, buccal mucosa, and tongue normal  NECK: supple, non-tender LYMPH:  no palpable lymphadenopathy in the cervical LUNGS: clear to auscultation with normal breathing  effort HEART: regular rate & rhythm, no murmurs, no lower extremity edema ABDOMEN: soft, non-tender, non-distended, normal bowel sounds Musculoskeletal: no cyanosis of digits and no clubbing  PSYCH: alert & oriented x 3, fluent speech NEURO: no focal motor/sensory deficits  LABORATORY DATA:  I have reviewed the data as listed Lab Results  Component Value Date   WBC 4.1 01/22/2020   HGB 9.3 (L) 01/22/2020   HCT 27.8 (L) 01/22/2020   MCV 87.9 01/22/2020   PLT 39.0 Repeated and verified X2. (LL) 01/22/2020   Lab Results  Component Value Date   NA 135 01/24/2020   K 3.4 (L) 01/24/2020   CL 100 01/24/2020   CO2 28 01/24/2020    RADIOGRAPHIC STUDIES: I have personally reviewed the radiological images as listed and agreed with the findings in the report. No results found.  PATHOLOGY: I have reviewed the pathology reports as documented in the oncologist history.

## 2020-01-31 ENCOUNTER — Inpatient Hospital Stay: Payer: Medicaid Other

## 2020-01-31 ENCOUNTER — Inpatient Hospital Stay: Payer: Medicaid Other | Attending: Hematology | Admitting: Hematology

## 2020-02-08 ENCOUNTER — Encounter: Payer: Self-pay | Admitting: Family Medicine

## 2020-02-18 NOTE — Progress Notes (Deleted)
[place] NOTE  Patient Care Team: Shelda Pal, DO as PCP - General (Family Medicine)  HEME/ONC OVERVIEW: 1. Thrombocytopenia -Episodic thrombocytopenia since 2020 (plt nadir ~50k) with mostly normal plt at baseline   ASSESSMENT & PLAN:   Thrombocytopenia -I reviewed the patient's records in detail, including PCP clinic notes, multiple ER visit notes, lab studies, imaging results, and pathology reports -In summary, patient has had normal platelet count until 2020, when CBC showed episodic thrombocytopenia (plts nadir'ed ~50k), but platelet count is mostly normal at baseline.  WBC and hemoglobin are normal.  Patient has had multiple ER visits for mostly GI-related symptoms, including nausea, vomiting, diarrhea, for which she has undergone extensive work-up, including EGD the colonoscopy and CT abdomen/pelvis. CT abdomen/pelvis in 2020 showed possible mild chronic colitis, but there was no other significant intra-abdominal abnormality.   EGD showed mild duodenitis, and colonoscopy was normal in 2020, including colon biopsy.  In 11/2019, she was diagnosed with UTI after presenting to [facility] ED for Covid-like symptoms.  She was evaluated by her PCP in 01/2020 for bilateral foot pain, and labs showed incidental plt count of 39k, w/ Hgb 9.3  Other labs were notable for elevated LFT's and multiple electrolyte abnormalities, which were attributed to heavy EtOH abuse during the pandemic.  Patient was referred to hematology for further evaluation of thrombocytopenia. -I reviewed the lab and imaging results in detail with the patient -Plt ___ -I personally reviewed the patient's peripheral blood smear today.  The red blood cells were of normal morphology.  There was no schistocytosis.  The white blood cells were of normal morphology. There were no peripheral circulating blasts. The platelets were of normal size and I verified that there were no platelet clumping. -I  discussed with the patient some of the common causes of thrombocytopenia, including infection, medications, liver disease, EtOH abuse, and less commonly, blood disorders -Given the patient's history of heavy EtOH abuse during the pandemic, I suspect that bone marrow suppression from EtOH abuse and liver disease are major contributors of thrombocytopenia -I have ordered HIV, Hep B/C serologies, as well as PT/PTT to assess for coagulopathy secondary to liver disease  -In addition, I have ordered nutritional studies, including iron, B12 and folate -Finally, as she already had some evidence of fatty liver disease on CT in 2020, and given the EtOH abuse, I have ordered abdominal US to assess for any liver abnormalities -Pending the work-up above, we will determine if further testing is indicated   Normocytic anemia -Etiology unclear, possibly due to bone marrow toxicity from EtOH abuse, nutritional deficiency, and occult GI bleeding -See nutritional studies as above -Given the history of duodenitis on EGD in 2020, I encouraged the patient to follow up with her gastroenterologist at [facility] for further work-up to rule out occult GI bleeding  No orders of the defined types were placed in this encounter.   The total time spent in the encounter was {CHL ONC TIME VISIT - ALPFX:[id number]}, including face-to-face time with the patient, review of various tests results, order additional studies/medications, documentation, and coordination of care plan.   All questions were answered. The patient knows to call the clinic with any problems, questions or concerns. No barriers to learning was detected.  Tish Men, MD 3/29/20219:31 AM  CHIEF COMPLAINTS/PURPOSE OF CONSULTATION:  "I am here for ***"  HISTORY OF PRESENTING ILLNESS:  Julie Kennedy 34 y.o. female is here because of incidental thrombocytopenia on CBC.  Patient has had normal  platelet count until 2020, when CBC showed episodic thrombocytopenia (plts  nadir'ed ~50k), but platelet count is mostly normal at baseline.  WBC and hemoglobin are normal.  Patient has had multiple ER visits for mostly GI-related symptoms, including nausea, vomiting, diarrhea, for which she has undergone extensive work-up, including EGD the colonoscopy and CT abdomen/pelvis. CT abdomen/pelvis in 2020 showed possible mild chronic colitis, but there was no other significant intra-abdominal abnormality.   EGD showed mild duodenitis, and colonoscopy was normal in 2020, including colon biopsy.  In 11/2019, she was diagnosed with UTI after presenting to [facility] ED for Covid-like symptoms.  She was evaluated by her PCP in 01/2020 for bilateral foot pain, and labs showed incidental plt count of 39k, w/ Hgb 9.3  Other labs were notable for elevated LFT's and multiple electrolyte abnormalities, which were attributed to heavy EtOH abuse during the pandemic.  Patient was referred to hematology for further evaluation of thrombocytopenia.  REVIEW OF SYSTEMS:   Constitutional: ( - ) fevers, ( - )  chills , ( - ) night sweats Eyes: ( - ) blurriness of vision, ( - ) double vision, ( - ) watery eyes Ears, nose, mouth, throat, and face: ( - ) mucositis, ( - ) sore throat Respiratory: ( - ) cough, ( - ) dyspnea, ( - ) wheezes Cardiovascular: ( - ) palpitation, ( - ) chest discomfort, ( - ) lower extremity swelling Gastrointestinal:  ( - ) nausea, ( - ) heartburn, ( - ) change in bowel habits Skin: ( - ) abnormal skin rashes Lymphatics: ( - ) new lymphadenopathy, ( - ) easy bruising Neurological: ( - ) numbness, ( - ) tingling, ( - ) new weaknesses Behavioral/Psych: ( - ) mood change, ( - ) new changes  All other systems were reviewed with the patient and are negative.  I have reviewed her chart and materials related to her cancer extensively and collaborated history with the patient. Summary of oncologic history is as follows: Oncology History   No history exists.    MEDICAL HISTORY:  Past  Medical History:  Diagnosis Date  . Anxiety   . Depression   . Frequent headaches   . Hard of hearing   . Hypertension     SURGICAL HISTORY: Past Surgical History:  Procedure Laterality Date  . CESAREAN SECTION    . CHOLECYSTECTOMY N/A 07/07/2018   Procedure: LAPAROSCOPIC CHOLECYSTECTOMY WITH INTRAOPERATIVE CHOLANGIOGRAM ERAS PATHWAY;  Surgeon: Griselda Miner, MD;  Location: [facility];  Service: General;  Laterality: N/A;  . TONSILLECTOMY AND ADENOIDECTOMY      SOCIAL HISTORY: Social History   Socioeconomic History  . Marital status: Single    Spouse name: Not on file  . Number of children: Not on file  . Years of education: Not on file  . Highest education level: Not on file  Occupational History  . Not on file  Tobacco Use  . Smoking status: Never Smoker  . Smokeless tobacco: Never Used  Substance and Sexual Activity  . Alcohol use: Yes    Comment: occ  . Drug use: Not Currently    Comment: pt denies a history, but cocaine listed from previous visit   . Sexual activity: Not Currently  Other Topics Concern  . Not on file  Social History Narrative  . Not on file   Social Determinants of Health   Financial Resource Strain:   . Difficulty of Paying Living Expenses:   Food Insecurity:   . Worried About  Running Out of Food in the Last Year:   . Ran Out of Food in the Last Year:   Transportation Needs:   . Lack of Transportation (Medical):   Marland Kitchen Lack of Transportation (Non-Medical):   Physical Activity:   . Days of Exercise per Week:   . Minutes of Exercise per Session:   Stress:   . Feeling of Stress :   Social Connections:   . Frequency of Communication with Friends and Family:   . Frequency of Social Gatherings with Friends and Family:   . Attends Religious Services:   . Active Member of Clubs or Organizations:   . Attends Banker Meetings:   Marland Kitchen Marital Status:   Intimate Partner Violence:   . Fear of Current or Ex-Partner:   . Emotionally  Abused:   Marland Kitchen Physically Abused:   . Sexually Abused:     FAMILY HISTORY: Family History  Problem Relation Age of Onset  . Arthritis Mother   . Depression Mother   . Hypertension Mother     ALLERGIES:  is allergic to percocet [oxycodone-acetaminophen].  MEDICATIONS:  Current Outpatient Medications  Medication Sig Dispense Refill  . amLODipine (NORVASC) 5 MG tablet Take 1 tablet (5 mg total) by mouth daily. 90 tablet 2  . hydrOXYzine (ATARAX/VISTARIL) 25 MG tablet TAKE 1 TO 3 TABLETS(25 TO 75 MG) BY MOUTH THREE TIMES DAILY AS NEEDED FOR ANXIETY 90 tablet 2  . Magnesium Oxide 400 (240 Mg) MG TABS Take 1 tablet (400 mg total) by mouth daily. 30 tablet 0  . norethindrone (MICRONOR) 0.35 MG tablet Take 1 tablet (0.35 mg total) by mouth daily. 1 Package 11  . norgestimate-ethinyl estradiol (ORTHO-CYCLEN,SPRINTEC,PREVIFEM) 0.25-35 MG-MCG tablet Take 1 tablet by mouth daily.    . potassium chloride SA (KLOR-CON) 20 MEQ tablet Take 1 tab twice daily. 30 tablet 0  . sertraline (ZOLOFT) 50 MG tablet Take 50 mg by mouth daily.    Marland Kitchen venlafaxine XR (EFFEXOR-XR) 37.5 MG 24 hr capsule Take 1 capsule (37.5 mg total) by mouth daily with breakfast. 30 capsule 1   No current facility-administered medications for this visit.    PHYSICAL EXAMINATION: ECOG PERFORMANCE STATUS: {CHL ONC ECOG PS:[id number]}  There were no vitals filed for this visit. There were no vitals filed for this visit.  GENERAL: alert, no distress and comfortable SKIN: skin color, texture, turgor are normal, no rashes or significant lesions EYES: conjunctiva are pink and non-injected, sclera clear OROPHARYNX: no exudate, no erythema; lips, buccal mucosa, and tongue normal  NECK: supple, non-tender LYMPH:  no palpable lymphadenopathy in the cervical LUNGS: clear to auscultation with normal breathing effort HEART: regular rate & rhythm, no murmurs, no lower extremity edema ABDOMEN: soft, non-tender, non-distended, normal bowel  sounds Musculoskeletal: no cyanosis of digits and no clubbing  PSYCH: alert & oriented x 3, fluent speech NEURO: no focal motor/sensory deficits  LABORATORY DATA:  I have reviewed the data as listed Lab Results  Component Value Date   WBC 4.1 01/22/2020   HGB 9.3 (L) 01/22/2020   HCT 27.8 (L) 01/22/2020   MCV 87.9 01/22/2020   PLT 39.0 Repeated and verified X2. (LL) 01/22/2020   Lab Results  Component Value Date   NA 135 01/24/2020   K 3.4 (L) 01/24/2020   CL 100 01/24/2020   CO2 28 01/24/2020    RADIOGRAPHIC STUDIES: I have personally reviewed the radiological images as listed and agreed with the findings in the report. No results found.  PATHOLOGY: I  have reviewed the pathology reports as documented in the oncologist history.

## 2020-02-19 ENCOUNTER — Telehealth: Payer: Self-pay | Admitting: Family

## 2020-02-19 ENCOUNTER — Inpatient Hospital Stay: Payer: Medicaid Other

## 2020-02-19 ENCOUNTER — Inpatient Hospital Stay: Payer: Medicaid Other | Admitting: Hematology

## 2020-02-19 NOTE — Telephone Encounter (Signed)
She also needs to schedule a follow up appointment with Dr. Carmelia Roller.

## 2020-02-19 NOTE — Telephone Encounter (Signed)
Julie Kennedy- please contact pt and let her know that I was notified by the hematology office that she missed her consultation appointment with them today for her very low platelet count and anemia.  I would recommend that she call their office to reschedule ASAP.

## 2020-02-19 NOTE — Telephone Encounter (Signed)
Left detail message about missed appointment and for patient to call back and schedule follow up with Dr. Dyann Kief as well

## 2020-02-20 ENCOUNTER — Other Ambulatory Visit: Payer: Self-pay

## 2020-02-21 ENCOUNTER — Ambulatory Visit (INDEPENDENT_AMBULATORY_CARE_PROVIDER_SITE_OTHER): Payer: Medicaid Other | Admitting: Family Medicine

## 2020-02-21 ENCOUNTER — Encounter: Payer: Self-pay | Admitting: Family Medicine

## 2020-02-21 ENCOUNTER — Other Ambulatory Visit: Payer: Self-pay

## 2020-02-21 VITALS — BP 130/98 | HR 137 | Temp 95.5°F | Ht 68.0 in | Wt 229.1 lb

## 2020-02-21 DIAGNOSIS — R202 Paresthesia of skin: Secondary | ICD-10-CM

## 2020-02-21 MED ORDER — PREDNISONE 20 MG PO TABS: 40.0000 mg | ORAL_TABLET | Freq: Every day | ORAL | 0 refills | Status: AC

## 2020-02-21 MED ORDER — NORTRIPTYLINE HCL 10 MG PO CAPS: 10.0000 mg | ORAL_CAPSULE | Freq: Every day | ORAL | 1 refills | Status: DC

## 2020-02-21 NOTE — Patient Instructions (Signed)
If the prednisone is really helpful, I would like to know.  Take the prednisone first and the next medicine afterwards.  If you do not hear anything about your referral in the next 1-2 weeks, call our office and ask for an update.  OK to look into CBD.   Let us know if you need anything.

## 2020-02-21 NOTE — Progress Notes (Signed)
Chief Complaint  Patient presents with  . Numbness    feet, hands and lips    Subjective: Patient is a 34 y.o. female here for follow-up.  Patient continues have burning in both of her feet.  Effexor did not help.  She is not having numbness in both of her palms burning in her mouth.  She had negative autoimmune work-up at her last visit.  Nothing she notices has been helpful, she is not having weakness or balance issues otherwise.  Past Medical History:  Diagnosis Date  . Anxiety   . Depression   . Frequent headaches   . Hard of hearing   . Hypertension     Objective: BP (!) 130/98 (BP Location: Left Arm, Patient Position: Sitting, Cuff Size: Normal)   Pulse (!) 137   Temp (!) 95.5 F (35.3 C) (Temporal)   Ht 5\' 8"  (1.727 m)   Wt 229 lb 2 oz (103.9 kg)   SpO2 98%   BMI 34.84 kg/m  General: Awake, appears stated age MSK: Tenderness to palpation over both feet, there is no tenderness over the hands, there is no asymmetry or muscle group atrophy Neuro: Gait appears normal, sensation is intact to pinprick throughout, it does not elicit pain Mouth: No lesions noted, no erythema, no drainage, no exudate Heart: RRR Lungs: No accessory muscle use Psych: Age appropriate judgment and insight, normal affect and mood  Assessment and Plan: Paresthesias - Plan: nortriptyline (PAMELOR) 10 MG capsule, predniSONE (DELTASONE) 20 MG tablet, Ambulatory referral to Neurology  Prednisone burst.  After that, nighttime nortriptyline.  Refer to neurology in the meanwhile.  Could consider duloxetine or Lyrica if no improvement over the next 3-4 weeks. Blood pressure noted but she is admittedly in pain as well.  Will monitor. The patient voiced understanding and agreement to the plan.  [place], DO 02/21/20  1:06 PM

## 2020-02-21 NOTE — Progress Notes (Deleted)
[place] NOTE  Patient Care Team: Shelda Pal, DO as PCP - General (Family Medicine)  HEME/ONC OVERVIEW: 1. Thrombocytopenia -Episodic thrombocytopenia since 2020 (plt nadir ~50k) with mostly normal plt at baseline   ASSESSMENT & PLAN:   Thrombocytopenia -I reviewed the patient's records in detail, including PCP clinic notes, multiple ER visit notes, lab studies, imaging results, and pathology reports -In summary, patient has had normal platelet count until 2020, when CBC showed episodic thrombocytopenia (plts nadir'ed ~50k), but platelet count is mostly normal at baseline.  WBC and hemoglobin are normal.  Patient has had multiple ER visits for mostly GI-related symptoms, including nausea, vomiting, diarrhea, for which she has undergone extensive work-up, including EGD the colonoscopy and CT abdomen/pelvis. CT abdomen/pelvis in 2020 showed possible mild chronic colitis, but there was no other significant intra-abdominal abnormality.   EGD showed mild duodenitis, and colonoscopy was normal in 2020, including colon biopsy.  In 11/2019, she was diagnosed with UTI after presenting to [facility] ED for Covid-like symptoms.  She was evaluated by her PCP in 01/2020 for bilateral foot pain, and labs showed incidental plt count of 39k, w/ Hgb 9.3  Other labs were notable for elevated LFT's and multiple electrolyte abnormalities, which were attributed to heavy EtOH abuse during the pandemic.  Patient was referred to hematology for further evaluation of thrombocytopenia. -I reviewed the lab and imaging results in detail with the patient -Plt ___ -I personally reviewed the patient's peripheral blood smear today.  The red blood cells were of normal morphology.  There was no schistocytosis.  The white blood cells were of normal morphology. There were no peripheral circulating blasts. The platelets were of normal size and I verified that there were no platelet clumping. -I  discussed with the patient some of the common causes of thrombocytopenia, including infection, medications, liver disease, EtOH abuse, and less commonly, blood disorders -Given the patient's history of heavy EtOH abuse during the pandemic, I suspect that bone marrow suppression from EtOH abuse and liver disease are major contributors of thrombocytopenia -I have ordered HIV, Hep B/C serologies, as well as PT/PTT to assess for coagulopathy secondary to liver disease  -In addition, I have ordered nutritional studies, including iron, B12 and folate -Finally, as she already had some evidence of fatty liver disease on CT in 2020, and given the EtOH abuse, I have ordered abdominal US to assess for any liver abnormalities -Pending the work-up above, we will determine if further testing is indicated   Normocytic anemia -Etiology unclear, possibly due to bone marrow toxicity from EtOH abuse, nutritional deficiency, and occult GI bleeding -See nutritional studies as above -Given the history of duodenitis on EGD in 2020, I encouraged the patient to follow up with her gastroenterologist at [facility] for further work-up to rule out occult GI bleeding  No orders of the defined types were placed in this encounter.   The total time spent in the encounter was {CHL ONC TIME VISIT - OZHYQ:[id number]}, including face-to-face time with the patient, review of various tests results, order additional studies/medications, documentation, and coordination of care plan.   All questions were answered. The patient knows to call the clinic with any problems, questions or concerns. No barriers to learning was detected.  Tish Men, MD 4/1/20219:03 AM  CHIEF COMPLAINTS/PURPOSE OF CONSULTATION:  "I am here for ***"  HISTORY OF PRESENTING ILLNESS:  Julie Kennedy 34 y.o. female is here because of incidental thrombocytopenia on CBC.  Patient has had normal  platelet count until 2020, when CBC showed episodic thrombocytopenia (plts  nadir'ed ~50k), but platelet count is mostly normal at baseline.  WBC and hemoglobin are normal.  Patient has had multiple ER visits for mostly GI-related symptoms, including nausea, vomiting, diarrhea, for which she has undergone extensive work-up, including EGD the colonoscopy and CT abdomen/pelvis. CT abdomen/pelvis in 2020 showed possible mild chronic colitis, but there was no other significant intra-abdominal abnormality.   EGD showed mild duodenitis, and colonoscopy was normal in 2020, including colon biopsy.  In 11/2019, she was diagnosed with UTI after presenting to [facility] ED for Covid-like symptoms.  She was evaluated by her PCP in 01/2020 for bilateral foot pain, and labs showed incidental plt count of 39k, w/ Hgb 9.3  Other labs were notable for elevated LFT's and multiple electrolyte abnormalities, which were attributed to heavy EtOH abuse during the pandemic.  Patient was referred to hematology for further evaluation of thrombocytopenia.  REVIEW OF SYSTEMS:   Constitutional: ( - ) fevers, ( - )  chills , ( - ) night sweats Eyes: ( - ) blurriness of vision, ( - ) double vision, ( - ) watery eyes Ears, nose, mouth, throat, and face: ( - ) mucositis, ( - ) sore throat Respiratory: ( - ) cough, ( - ) dyspnea, ( - ) wheezes Cardiovascular: ( - ) palpitation, ( - ) chest discomfort, ( - ) lower extremity swelling Gastrointestinal:  ( - ) nausea, ( - ) heartburn, ( - ) change in bowel habits Skin: ( - ) abnormal skin rashes Lymphatics: ( - ) new lymphadenopathy, ( - ) easy bruising Neurological: ( - ) numbness, ( - ) tingling, ( - ) new weaknesses Behavioral/Psych: ( - ) mood change, ( - ) new changes  All other systems were reviewed with the patient and are negative.  I have reviewed her chart and materials related to her cancer extensively and collaborated history with the patient. Summary of oncologic history is as follows: Oncology History   No history exists.    MEDICAL HISTORY:  Past  Medical History:  Diagnosis Date  . Anxiety   . Depression   . Frequent headaches   . Hard of hearing   . Hypertension     SURGICAL HISTORY: Past Surgical History:  Procedure Laterality Date  . CESAREAN SECTION    . CHOLECYSTECTOMY N/A 07/07/2018   Procedure: LAPAROSCOPIC CHOLECYSTECTOMY WITH INTRAOPERATIVE CHOLANGIOGRAM ERAS PATHWAY;  Surgeon: Griselda Miner, MD;  Location: [facility];  Service: General;  Laterality: N/A;  . TONSILLECTOMY AND ADENOIDECTOMY      SOCIAL HISTORY: Social History   Socioeconomic History  . Marital status: Single    Spouse name: Not on file  . Number of children: Not on file  . Years of education: Not on file  . Highest education level: Not on file  Occupational History  . Not on file  Tobacco Use  . Smoking status: Never Smoker  . Smokeless tobacco: Never Used  Substance and Sexual Activity  . Alcohol use: Yes    Comment: occ  . Drug use: Not Currently    Comment: pt denies a history, but cocaine listed from previous visit   . Sexual activity: Not Currently  Other Topics Concern  . Not on file  Social History Narrative  . Not on file   Social Determinants of Health   Financial Resource Strain:   . Difficulty of Paying Living Expenses:   Food Insecurity:   . Worried About  Running Out of Food in the Last Year:   . Ran Out of Food in the Last Year:   Transportation Needs:   . Lack of Transportation (Medical):   Marland Kitchen Lack of Transportation (Non-Medical):   Physical Activity:   . Days of Exercise per Week:   . Minutes of Exercise per Session:   Stress:   . Feeling of Stress :   Social Connections:   . Frequency of Communication with Friends and Family:   . Frequency of Social Gatherings with Friends and Family:   . Attends Religious Services:   . Active Member of Clubs or Organizations:   . Attends Banker Meetings:   Marland Kitchen Marital Status:   Intimate Partner Violence:   . Fear of Current or Ex-Partner:   . Emotionally  Abused:   Marland Kitchen Physically Abused:   . Sexually Abused:     FAMILY HISTORY: Family History  Problem Relation Age of Onset  . Arthritis Mother   . Depression Mother   . Hypertension Mother     ALLERGIES:  is allergic to percocet [oxycodone-acetaminophen].  MEDICATIONS:  Current Outpatient Medications  Medication Sig Dispense Refill  . amLODipine (NORVASC) 5 MG tablet Take 1 tablet (5 mg total) by mouth daily. 90 tablet 2  . hydrOXYzine (ATARAX/VISTARIL) 25 MG tablet TAKE 1 TO 3 TABLETS(25 TO 75 MG) BY MOUTH THREE TIMES DAILY AS NEEDED FOR ANXIETY 90 tablet 2  . Magnesium Oxide 400 (240 Mg) MG TABS Take 1 tablet (400 mg total) by mouth daily. 30 tablet 0  . norethindrone (MICRONOR) 0.35 MG tablet Take 1 tablet (0.35 mg total) by mouth daily. 1 Package 11  . norgestimate-ethinyl estradiol (ORTHO-CYCLEN,SPRINTEC,PREVIFEM) 0.25-35 MG-MCG tablet Take 1 tablet by mouth daily.    . potassium chloride SA (KLOR-CON) 20 MEQ tablet Take 1 tab twice daily. 30 tablet 0  . sertraline (ZOLOFT) 50 MG tablet Take 50 mg by mouth daily.    Marland Kitchen venlafaxine XR (EFFEXOR-XR) 37.5 MG 24 hr capsule Take 1 capsule (37.5 mg total) by mouth daily with breakfast. 30 capsule 1   No current facility-administered medications for this visit.    PHYSICAL EXAMINATION: ECOG PERFORMANCE STATUS: {CHL ONC ECOG PS:[id number]}  There were no vitals filed for this visit. There were no vitals filed for this visit.  GENERAL: alert, no distress and comfortable SKIN: skin color, texture, turgor are normal, no rashes or significant lesions EYES: conjunctiva are pink and non-injected, sclera clear OROPHARYNX: no exudate, no erythema; lips, buccal mucosa, and tongue normal  NECK: supple, non-tender LYMPH:  no palpable lymphadenopathy in the cervical LUNGS: clear to auscultation with normal breathing effort HEART: regular rate & rhythm, no murmurs, no lower extremity edema ABDOMEN: soft, non-tender, non-distended, normal bowel  sounds Musculoskeletal: no cyanosis of digits and no clubbing  PSYCH: alert & oriented x 3, fluent speech NEURO: no focal motor/sensory deficits  LABORATORY DATA:  I have reviewed the data as listed Lab Results  Component Value Date   WBC 4.1 01/22/2020   HGB 9.3 (L) 01/22/2020   HCT 27.8 (L) 01/22/2020   MCV 87.9 01/22/2020   PLT 39.0 Repeated and verified X2. (LL) 01/22/2020   Lab Results  Component Value Date   NA 135 01/24/2020   K 3.4 (L) 01/24/2020   CL 100 01/24/2020   CO2 28 01/24/2020    RADIOGRAPHIC STUDIES: I have personally reviewed the radiological images as listed and agreed with the findings in the report. No results found.  PATHOLOGY: I  have reviewed the pathology reports as documented in the oncologist history.

## 2020-02-25 ENCOUNTER — Other Ambulatory Visit: Payer: Self-pay | Admitting: Family Medicine

## 2020-02-25 ENCOUNTER — Inpatient Hospital Stay: Payer: Medicaid Other

## 2020-02-25 ENCOUNTER — Inpatient Hospital Stay: Payer: Medicaid Other | Admitting: Hematology

## 2020-02-25 DIAGNOSIS — R202 Paresthesia of skin: Secondary | ICD-10-CM

## 2020-03-06 ENCOUNTER — Telehealth: Payer: Self-pay

## 2020-03-06 NOTE — Telephone Encounter (Signed)
Nurse Assessment Nurse: Clarita Leber, RN, Deborah Date/Time (Eastern Time): 03/05/2020 3:27:33 PM Confirm and document reason for call. If symptomatic, describe symptoms. ---The caller states that she is having numbness all over. She states that she can't walk. She is in a wheelchair. She has been to the ER and the neurologist this week. She states that they neurologist wanted her to have a nerve test. Has the patient had close contact with a person known or suspected to have the novel coronavirus illness OR traveled / lives in area with major community spread (including international travel) in the last 14 days from the onset of symptoms? * If Asymptomatic, screen for exposure and travel within the last 14 days. ---No Does the patient have any new or worsening symptoms? ---Yes Will a triage be completed? ---Yes Related visit to physician within the last 2 weeks? ---Yes Does the PT have any chronic conditions? (i.e. diabetes, asthma, this includes High risk factors for pregnancy, etc.) ---Yes List chronic conditions. ---anxiety and hypertension Is the patient pregnant or possibly pregnant? (Ask all females between the ages of 35-55) ---No Is this a behavioral health or substance abuse call? ---No Guidelines Guideline Title Affirmed Question Affirmed Notes Nurse Date/Time (Eastern Time) Anxiety and Panic Attack Patient sounds very upset or troubled to the triager Clarita Leber, RN, Gavin Pound 03/05/2020 3:31:28 PM  Call Id: [id number] Disp. Time Lamount Cohen Time) Disposition Final User 03/05/2020 3:06:09 PM Attempt made - message left Clarita Leber, RN, Gavin Pound 03/05/2020 3:39:56 PM See PCP within 24 Hours Yes Womble, RN, Bernadene Bell

## 2020-03-06 NOTE — Telephone Encounter (Signed)
Spoke with patient regarding symptoms.  Patient reports numbness is worsening and is now throughout entire body and she is unable to move.  When asked if she has contacted Neurology, she stated they did nothing for her. Pt reports the only medication that has worked in the past is Morphine she was given while at the hospital.  Advised patient since she is unable to [facility] for herself to call EMS for transportation to the ER. Patient verbalized understanding.

## 2020-03-07 ENCOUNTER — Other Ambulatory Visit: Payer: Medicaid Other

## 2020-03-12 ENCOUNTER — Encounter: Payer: Self-pay | Admitting: Family Medicine

## 2020-03-12 ENCOUNTER — Other Ambulatory Visit: Payer: Medicaid Other

## 2020-03-12 ENCOUNTER — Ambulatory Visit (INDEPENDENT_AMBULATORY_CARE_PROVIDER_SITE_OTHER): Payer: Medicaid Other | Admitting: Family Medicine

## 2020-03-12 ENCOUNTER — Other Ambulatory Visit: Payer: Self-pay

## 2020-03-12 VITALS — BP 118/78 | HR 120 | Temp 97.5°F | Ht 68.0 in | Wt 215.0 lb

## 2020-03-12 DIAGNOSIS — E538 Deficiency of other specified B group vitamins: Secondary | ICD-10-CM | POA: Diagnosis not present

## 2020-03-12 DIAGNOSIS — E876 Hypokalemia: Secondary | ICD-10-CM

## 2020-03-12 DIAGNOSIS — M792 Neuralgia and neuritis, unspecified: Secondary | ICD-10-CM | POA: Diagnosis not present

## 2020-03-12 DIAGNOSIS — R269 Unspecified abnormalities of gait and mobility: Secondary | ICD-10-CM

## 2020-03-12 LAB — BASIC METABOLIC PANEL
BUN: 12 mg/dL (ref 6–23)
CO2: 27 mEq/L (ref 19–32)
Calcium: 9.6 mg/dL (ref 8.4–10.5)
Chloride: 97 mEq/L (ref 96–112)
Creatinine, Ser: 0.47 mg/dL (ref 0.40–1.20)
GFR: 153.64 mL/min (ref >60.00)
Glucose, Bld: 127 mg/dL — ABNORMAL HIGH (ref 70–99)
Potassium: 5.1 mEq/L (ref 3.5–5.1)
Sodium: 134 mEq/L — ABNORMAL LOW (ref 135–145)

## 2020-03-12 LAB — MAGNESIUM: Magnesium: 2.3 mg/dL (ref 1.5–2.5)

## 2020-03-12 LAB — VITAMIN B12: Vitamin B-12: 708 pg/mL (ref 211–911)

## 2020-03-12 MED ORDER — PREGABALIN 75 MG PO CAPS: 75.0000 mg | ORAL_CAPSULE | Freq: Two times a day (BID) | ORAL | 1 refills | Status: DC

## 2020-03-12 NOTE — Progress Notes (Signed)
Chief Complaint  Patient presents with  . Leg Pain    leg, hand and back numbness    Subjective: Patient is a 34 y.o. female here for f/u.  Patient presents with worsening neuropathic pain that seems to migrate.  Most recently 1 week ago she started experiencing difficulty walking due to both balance issues and strength.  She has been to the emergency department and had a normal cervical thoracic MRI.  She is not having any vision changes but is concerned about MS.  She saw a neurologist who declined to doing a NCT because of how much pain she was in.  She is frustrated with that experience.  She has been trialed on nortriptyline, venlafaxine, gabapentin, and prednisone without relief.  She denies any recent travel or sick contacts.  No fevers.  She stopped all her prescription medications.  She no longer drinks alcohol and takes no supplements or illicit drugs.  Past Medical History:  Diagnosis Date  . Anxiety   . Depression   . Frequent headaches   . Hard of hearing   . Hypertension     Objective: BP 118/78 (BP Location: Left Arm, Patient Position: Sitting, Cuff Size: Normal)   Pulse (!) 120   Temp (!) 97.5 F (36.4 C) (Temporal)   Ht 5\' 8"  (1.727 m)   Wt 215 lb (97.5 kg)   SpO2 97%   BMI 32.69 kg/m  General: Awake, appears stated age HEENT: MMM, EOMi Neuro: DTRs equal and symmetric throughout, no clonus; 4/5 strength with hip flexion bilaterally and right-sided knee extension; 5/5 strength throughout otherwise; sensation is intact to light touch bilaterally equally. Cardio: Brisk capillary refill. Lungs: No accessory muscle use Psych: Appropriate judgment and insight, tearful  Assessment and Plan: Neuropathic pain - Plan: Sjogrens syndrome-A extractable nuclear antibody, Sjogrens syndrome-B extractable nuclear antibody, Ambulatory referral to Neurology, MR Brain W Wo Contrast  Hypomagnesemia - Plan: Magnesium  Hypokalemia - Plan: Basic metabolic panel  Low vitamin B12  level - Plan: B12  Gait abnormality - Plan: MR Lumbar Spine Wo Contrast, MR Brain W Wo Contrast  I do think she needs an EMG and nerve conduction test.  Will refer her to [facility], hopefully they can get her in urgently. Trial Lyrica.  If that is no better, will start tramadol and refer her to the pain management team. We will try to order imaging prior to seeing the specialist. The patient voiced understanding and agreement to the plan.  [facility] [place], DO 03/12/20  12:50 PM

## 2020-03-12 NOTE — Patient Instructions (Signed)
Someone will be in touch regarding your imaging studies.  If you do not hear anything about your referral in the next 1-2 weeks, call our office and ask for an update.  Give [place] 2-3 business days to get the results of your labs back.  Send me a message next week if you are not having any improvement with the Lyrica.    Keep the diet clean and stay active.

## 2020-03-14 LAB — SJOGRENS SYNDROME-B EXTRACTABLE NUCLEAR ANTIBODY: SSB (La) (ENA) Antibody, IgG: <1 AI

## 2020-03-14 LAB — SJOGRENS SYNDROME-A EXTRACTABLE NUCLEAR ANTIBODY: SSA (Ro) (ENA) Antibody, IgG: <1 AI

## 2020-03-31 ENCOUNTER — Other Ambulatory Visit: Payer: Self-pay | Admitting: Family Medicine

## 2020-03-31 MED ORDER — LORAZEPAM 0.5 MG PO TABS: ORAL_TABLET | ORAL | 0 refills | Status: DC

## 2020-04-05 ENCOUNTER — Ambulatory Visit (HOSPITAL_BASED_OUTPATIENT_CLINIC_OR_DEPARTMENT_OTHER)
Admission: RE | Admit: 2020-04-05 | Discharge: 2020-04-05 | Disposition: A | Discharge status: Home / Self Care | Payer: Medicaid Other | Source: Ambulatory Visit | Attending: Family Medicine | Admitting: Family Medicine

## 2020-04-05 ENCOUNTER — Other Ambulatory Visit: Payer: Self-pay

## 2020-04-05 DIAGNOSIS — M792 Neuralgia and neuritis, unspecified: Secondary | ICD-10-CM | POA: Insufficient documentation

## 2020-04-05 DIAGNOSIS — R269 Unspecified abnormalities of gait and mobility: Secondary | ICD-10-CM | POA: Insufficient documentation

## 2020-04-05 MED ORDER — GADOBUTROL 1 MMOL/ML IV SOLN
9.0000 mL | Freq: Once | INTRAVENOUS | Status: AC | PRN
  Administered 2020-04-05: 9 mL via INTRAVENOUS

## 2020-04-07 ENCOUNTER — Telehealth: Payer: Self-pay

## 2020-04-07 ENCOUNTER — Other Ambulatory Visit: Payer: Self-pay | Admitting: Family Medicine

## 2020-04-07 DIAGNOSIS — R29898 Other symptoms and signs involving the musculoskeletal system: Secondary | ICD-10-CM

## 2020-04-07 DIAGNOSIS — M5126 Other intervertebral disc displacement, lumbar region: Secondary | ICD-10-CM

## 2020-04-07 NOTE — Telephone Encounter (Signed)
The patient would like results from her MRI

## 2020-04-07 NOTE — Telephone Encounter (Signed)
Patient called in to see if the nurse or Dr. Carmelia Roller could give her a call to discuss her Lab  results. Please call the patient back at  [phone]  As soon as possible.

## 2020-04-08 ENCOUNTER — Other Ambulatory Visit: Payer: Self-pay | Admitting: Family Medicine

## 2020-04-08 MED ORDER — LORAZEPAM 0.5 MG PO TABS: 0.5000 mg | ORAL_TABLET | Freq: Every day | ORAL | 0 refills | Status: DC | PRN

## 2020-04-10 DIAGNOSIS — M5126 Other intervertebral disc displacement, lumbar region: Secondary | ICD-10-CM

## 2020-04-10 DIAGNOSIS — R29898 Other symptoms and signs involving the musculoskeletal system: Secondary | ICD-10-CM

## 2020-04-18 ENCOUNTER — Encounter: Payer: Self-pay | Admitting: Family Medicine

## 2020-04-18 ENCOUNTER — Other Ambulatory Visit: Payer: Self-pay

## 2020-04-18 ENCOUNTER — Ambulatory Visit (INDEPENDENT_AMBULATORY_CARE_PROVIDER_SITE_OTHER): Payer: Medicaid Other | Admitting: Family Medicine

## 2020-04-18 VITALS — BP 122/84 | HR 118 | Temp 96.6°F

## 2020-04-18 DIAGNOSIS — F418 Other specified anxiety disorders: Secondary | ICD-10-CM

## 2020-04-18 MED ORDER — AMITRIPTYLINE HCL 25 MG PO TABS: 25.0000 mg | ORAL_TABLET | Freq: Every day | ORAL | 3 refills | Status: DC

## 2020-04-18 MED ORDER — LORAZEPAM 0.5 MG PO TABS: 0.5000 mg | ORAL_TABLET | Freq: Two times a day (BID) | ORAL | 2 refills | Status: DC | PRN

## 2020-04-18 NOTE — Progress Notes (Signed)
Chief Complaint  Patient presents with  . Follow-up    MRI  . Medication Refill    Lyrica and ativan    Subjective: Patient is a 34 y.o. female here for f/u. Here w BF.  Patient has been dealing with pain issues.  She had an MRI that prompted referral to neurosurgical team.  She has an appoint with the spine specialist on 6/10.  This is been causing her lots of anxiety.  She was prescribed hydroxyzine that did not help.  She was prescribed Ativan for an MRI which did help.  She has been taking twice a day which helps her sleep and calm down.  Also helps with her pain in conjunction with Lyrica.  No side effects with either of these medications.  Past Medical History:  Diagnosis Date  . Anxiety   . Depression   . Frequent headaches   . Hard of hearing   . Hypertension     Objective: BP 122/84 (BP Location: Left Arm, Patient Position: Sitting, Cuff Size: Normal)   Pulse (!) 118   Temp (!) 96.6 F (35.9 C) (Temporal)   SpO2 98%  General: Awake, appears stated age HEENT: HOH Lungs: No accessory muscle use Psych: Age appropriate judgment and insight, normal affect and mood  Assessment and Plan: Situational anxiety - Plan: LORazepam (ATIVAN) 0.5 MG tablet, amitriptyline (ELAVIL) 25 MG tablet  I did discuss with her that I did not want her getting pregnant on these medications.  I will continue Ativan on an as-needed basis but emphasized that with the addictive potential of this medication, it is not something I want her on long-term.  She voiced understanding and agreement to this approach.  I will add Elavil to decrease the need for the Ativan.  Hopefully this will have an analgesic effect for her as well as addressing anxiety/sleep issues. I would like to see her in 6 weeks to follow-up. The patient voiced understanding and agreement to the plan.  Jilda Roche Pepper [place], DO 04/18/20  3:30 PM

## 2020-04-18 NOTE — Patient Instructions (Signed)
Please consider counseling. Contact [phone] to schedule an appointment or inquire about cost/insurance coverage.  Try not to take the Ativan unless needed.  Coping skills Choose 5 that work for you:  Take a deep breath  Count to 20  Read a book  Do a puzzle  Meditate  Bake  Sing  Knit  Garden  Pray  Go outside  Call a friend  Listen to music  Take a walk  Color  Send a note  Take a bath  Watch a movie  Be alone in a quiet place  Pet an animal  Visit a friend  Journal  Exercise  Stretch   Let us know if you need anything.

## 2020-05-05 ENCOUNTER — Other Ambulatory Visit: Payer: Self-pay | Admitting: Family Medicine

## 2020-05-05 DIAGNOSIS — M792 Neuralgia and neuritis, unspecified: Secondary | ICD-10-CM

## 2020-05-05 MED ORDER — PREGABALIN 75 MG PO CAPS: 75.0000 mg | ORAL_CAPSULE | Freq: Two times a day (BID) | ORAL | 1 refills | Status: DC

## 2020-05-06 ENCOUNTER — Other Ambulatory Visit: Payer: Self-pay | Admitting: Family Medicine

## 2020-05-06 DIAGNOSIS — M792 Neuralgia and neuritis, unspecified: Secondary | ICD-10-CM

## 2020-05-08 ENCOUNTER — Telehealth: Payer: Self-pay | Admitting: Family Medicine

## 2020-05-08 NOTE — Telephone Encounter (Signed)
[facility] Address: [address], [place], [place] [address] Hours:  Open ? Closes 5PM Phone: [phone]   Please update referral for patient : indicated if you would like patient to have an consult or nerve conduction study.

## 2020-05-09 NOTE — Telephone Encounter (Signed)
From my end both, but would expect the consultation to dictate whether or not she should have the NCT.

## 2020-05-13 ENCOUNTER — Other Ambulatory Visit: Payer: Self-pay | Admitting: Family Medicine

## 2020-05-13 MED ORDER — AMITRIPTYLINE HCL 50 MG PO TABS: 50.0000 mg | ORAL_TABLET | Freq: Every day | ORAL | 3 refills | Status: DC

## 2020-05-13 NOTE — Telephone Encounter (Signed)
Referral updated and msg sent to [facility]

## 2020-05-13 NOTE — Telephone Encounter (Signed)
KO- can you update referral notes please?

## 2020-07-07 NOTE — Progress Notes (Addendum)
[facility]    Provider:  Dr Lucia GaskinsAhern Requesting Provider: Sharlene DoryWendling, Nicholas Paul* Primary Care Provider:  Sharlene DoryWendling, Nicholas Paul, DO  CC:  Burning in the hands and feet  HPI:  Julie Kennedy is a 34 y.o. female here as requested by Sharlene DoryWendling, Nicholas Paul* for neuropathic pain.  She has a past medical history of anxiety, depression, frequent headaches, transient neurologic symptoms, hard of hearing and hypertension.  Patient was already seen by neurology in April 2021 for dysesthesias at multiple sites, she reported her feet started going numb with tingling 2 months prior, over the last month "is ridiculous", her mouth reported swollen, her head and hands her legs and neck hurting, she reported needing help getting her tennis shoes on, like pins-and-needles and burning sensation 10 out of 10 and she was in a wheelchair, she uses crutches at home, she felt 6 months prior and has a 34-year-old, she got up to run and slipped on the living room rug.  MRI of the cervical and thoracic spine March 01, 2020 showed minimal disc bulging without significant stenosis with normal thoracic spine.  MRI of the brain 11/10/2018 normal.  Neurologic exam was reassuring, with reserved reflexes, 1+ symmetric in upper and lower extremity, EMG nerve conduction study was suggested but she declined,exam appeared normal.  Meds she tried include Pamelor that did not work, gabapentin, Lyrica, prednisone, labs including sed rate, TSH, Sjogren's and EMG nerve conduction study was offered.  Patient is here alone with tingling and numbness burning in the hands and feet, hands are more numb than the feet, she reports she has "spinal damage", she fell back in January, she jumped off the couch she should often alarm and she fell on her bottom, she took Tylenol for a couple days and then in April she could not walk at all, she fell on her buttocks, progressively worsened and by April she had burning in her feet and  hands, she was in a wheelchair, she couldn't walk at all due to weakness, she couldn't get in the shower, she went to a neurologist, she went to a spine specialist, she was hospitalist for this and then [facility] and was unhappy with the doctor, her walking has improved since she saw a chiropractor but still has symptoms. She takes Lyrica, amitriptyline, she is improved and is taking B12. It may keep her up all night. She has burning on the bottom of her feet and her toes are throbbing, hands tingle, but symptoms were from head to toe prior to improvement. No other precipitating factors. No other focal neurologic deficits, associated symptoms, inciting events or modifiable factors.  Reviewed notes, labs and imaging from outside physicians, which showed:   Patient brings MRI on a CD 04/06/2020, I reviewed the images and agree with the report that shows L4-L5 disc protrusion resulting in moderate spinal stenosis and left greater than right lateral recess stenosis.  L5-S1 disc protrusion with right lateral recess stenosis.  Personally reviewed MRI brain images 04/06/2020 which was normal.  04/2019 b-12 315    I reviewed notes from Sandford CrazeMelissa O'Sullivan back in March of this year, patient had Covid symptoms a few weeks prior to appointment on 01/21/2020, she developed bilateral foot pain ankle to toes on both feet, burning pain, hurts to walk and wear shoes, reports pain started a week prior to appointment, having trouble sleeping due to the pain, both feet are very sensitive, feet cold, she denied low back pain redness or  swelling in her feet, her Covid symptoms resolved and she denied bowel bladder incontinence.  She was found to have low magnesium, potassium and B12. Per Dr. Hollie Beach notes in June of this year, patient has been continuing dealing with pain issues, she had an MRI that prompted referral to neurosurgical team (moderate spinal stenosis of the lumbar spine), she apparently had an  appointment with the spine specialist in June of this year, lots of anxiety, taking Lyrica for pain.  Also Elavil was added.  Per Dr. Hollie Beach notes in April of this year, patient reported burning in both her feet, Effexor did not help, negative autoimmune work-up, no weakness or balance issues otherwise.  She also reported numbness of feet, hands and lips.  Also tried nortriptyline.  And a prednisone burst.  Cymbalta was considered.  MRI of the lumbar spine showed moderate spinal stenosis and L4-L5 lateral recess stenosis, L5-S1 disc protrusion with right lateral recess stenosis.  MRI of the brain was negative.  I personally reviewed images and agree with the above.  These images were completed Apr 06, 2020.     Review of Systems: Patient complains of symptoms per HPI as well as the following symptoms:numbness, weakness. Pertinent negatives and positives per HPI. All others negative.   Social History   Socioeconomic History  . Marital status: Significant Other    Spouse name: Not on file  . Number of children: 1  . Years of education: Not on file  . Highest education level: Not on file  Occupational History  . Not on file  Tobacco Use  . Smoking status: Never Smoker  . Smokeless tobacco: Never Used  Vaping Use  . Vaping Use: Never used  Substance and Sexual Activity  . Alcohol use: Not Currently    Comment: was casually   . Drug use: Never    Comment: pt denies a history, but cocaine listed from previous visit   . Sexual activity: Not Currently  Other Topics Concern  . Not on file  Social History Narrative   Lives at home with boyfriend and daughter    Right handed   Caffeine: seldom    Social Determinants of Health   Financial Resource Strain:   . Difficulty of Paying Living Expenses:   Food Insecurity:   . Worried About Programme researcher, broadcasting/film/video in the Last Year:   . Barista in the Last Year:   Transportation Needs:   . Freight forwarder (Medical):   Marland Kitchen Lack of  Transportation (Non-Medical):   Physical Activity:   . Days of Exercise per Week:   . Minutes of Exercise per Session:   Stress:   . Feeling of Stress :   Social Connections:   . Frequency of Communication with Friends and Family:   . Frequency of Social Gatherings with Friends and Family:   . Attends Religious Services:   . Active Member of Clubs or Organizations:   . Attends Banker Meetings:   Marland Kitchen Marital Status:   Intimate Partner Violence:   . Fear of Current or Ex-Partner:   . Emotionally Abused:   Marland Kitchen Physically Abused:   . Sexually Abused:     Family History  Problem Relation Age of Onset  . Arthritis Mother   . Depression Mother   . Hypertension Mother   . Neuropathy Neg Hx     Past Medical History:  Diagnosis Date  . Anxiety   . Depression   . Frequent headaches   .  Hard of hearing   . Hypertension     Patient Active Problem List   Diagnosis Date Noted  . Paresthesias 07/08/2020  . Neuropathic pain 07/08/2020  . Thrombocytopenia (HCC) 01/28/2020  . Normocytic anemia 01/28/2020  . TIA (transient ischemic attack) 10/28/2018  . Essential hypertension 04/27/2018  . GAD (generalized anxiety disorder) 04/27/2018    Past Surgical History:  Procedure Laterality Date  . CESAREAN SECTION    . CHOLECYSTECTOMY N/A 07/07/2018   Procedure: LAPAROSCOPIC CHOLECYSTECTOMY WITH INTRAOPERATIVE CHOLANGIOGRAM ERAS PATHWAY;  Surgeon: Griselda Miner, MD;  Location: [facility];  Service: General;  Laterality: N/A;  . TONSILLECTOMY AND ADENOIDECTOMY      Current Outpatient Medications  Medication Sig Dispense Refill  . ACETAMINOPHEN PO Take by mouth as needed.    Marland Kitchen amitriptyline (ELAVIL) 50 MG tablet Take 1 tablet (50 mg total) by mouth at bedtime. 30 tablet 3  . LORazepam (ATIVAN) 0.5 MG tablet Take 1 tablet (0.5 mg total) by mouth 2 (two) times daily as needed for anxiety. Take 1 tab 30-60 min prior to procedure. Can repeat in 20 minutes if ineffective. 60 tablet 2    . OVER THE COUNTER MEDICATION in the morning and at bedtime. "Nerve Fix It"    . OVER THE COUNTER MEDICATION in the morning and at bedtime. Ultimate Women Vitamin    . pregabalin (LYRICA) 75 MG capsule Take 1 capsule (75 mg total) by mouth 2 (two) times daily. 60 capsule 1   No current facility-administered medications for this visit.    Allergies as of 07/08/2020 - Review Complete 07/08/2020  Allergen Reaction Noted  . Percocet [oxycodone-acetaminophen] Other (See Comments) 07/04/2018    Vitals: BP (!) 136/98 (BP Location: Left Arm, Patient Position: Sitting, Cuff Size: Large)   Pulse 94   Ht 5\' 8"  (1.727 m)   Wt 241 lb (109.3 kg)   BMI 36.64 kg/m  Last Weight:  Wt Readings from Last 1 Encounters:  07/08/20 241 lb (109.3 kg)   Last Height:   Ht Readings from Last 1 Encounters:  07/08/20 5\' 8"  (1.727 m)     Physical exam: Exam: Gen: teary and anxious, conversant, well nourised, obese, well groomed                     CV: RRR, no MRG. No Carotid Bruits. No peripheral edema, warm, nontender Eyes: Conjunctivae clear without exudates or hemorrhage  Neuro: Detailed Neurologic Exam  Speech:    Speech is normal; fluent and spontaneous with normal comprehension.  Cognition:    The patient is oriented to person, place, and time;     recent and remote memory intact;     language fluent;     normal attention, concentration,     fund of knowledge Cranial Nerves:    The pupils are equal, round, and reactive to light. The fundi areflat. Visual fields are full to finger confrontation. Extraocular movements are intact. Trigeminal sensation is intact and the muscles of mastication are normal. The face is symmetric. The palate elevates in the midline. Hearing impaired. Voice is normal. Shoulder shrug is normal. The tongue has normal motion without fasciculations.   Coordination:    Normal finger to nose and heel to shin.   Gait:    Heel-toe and tandem gait intact with some  imbalance. sligt wide based and antalgic gait  Motor Observation:    No asymmetry, no atrophy, and no involuntary movements noted. Tone:    Normal muscle tone.  Posture:    Posture is normal. normal erect    Strength:    Strength is V/V in the upper and lower limbs.      Sensation: Dysesthesia and allodynia of both hands and both feet. Sway on Romberg.       Reflex Exam:  DTR's: trace reflexes throughout but she is guarding.      Toes:    The toes are downgoing bilaterally.   Clonus:    Clonus is absent.    Assessment/Plan:  Patient with acute onset paresthesias in the feet after falling on her buttocks with moderately severe lumbar spinal stenosis,, severe burning, paresthesias, on multiple nerve pain medications. Then developed symptoms in the hands. MRI of the brain and cervical spine unremarkable. MRI Lumbar spine with moderately severe stenosis. Exam shows trace reflexes (but she is guarding so difficult to assess) and Dysesthesia and allodynia of both hands and both feet.   - B12 deficiency, B12 was194 six months ago can cause a peripheral neuropathy. Will check other labs.  - Moderate spinal stenosis lumbar spine: Doesn't explain hand symptoms.  - EMG/NCS: Both hands and both legs are the same symptomatically, will test one arm and one leg to see if foot symptoms are of different etiology than hand symptoms - Pain management referral - She declines LP but consider it to look for elevated protein  Orders Placed This Encounter  Procedures  . B12 and Folate Panel  . Methylmalonic acid, serum  . Hemoglobin A1c  . Vitamin B1  . HIV Antibody (routine testing w rflx)  . Sedimentation rate  . ANA w/Reflex  . RPR  . Rheumatoid factor  . Heavy metals, blood  . Vitamin B6  . CBC  . Comprehensive metabolic panel  . Ambulatory referral to Pain Clinic  . Ambulatory referral to Neurosurgery  . NCV with EMG(electromyography)   No orders of the defined types were placed  in this encounter.   Cc: Sharlene Dory*,  Sharlene Dory, DO  Naomie Dean, MD  [facility] [address] [place], [place] [address]  I spent over 90 minutes of face-to-face and non-face-to-face time with patient on the  1. Paresthesias   2. Other polyneuropathy   3. B12 deficiency   4. Neuropathic pain   5. Spinal stenosis of lumbar region without neurogenic claudication    diagnosis.  This included previsit chart review, lab review, study review, order entry, electronic health record documentation, patient education on the different diagnostic and therapeutic options, counseling and coordination of care, risks and benefits of management, compliance, or risk factor reduction  Phone [phone] Fax [phone]

## 2020-07-08 ENCOUNTER — Encounter: Payer: Self-pay | Admitting: Neurology

## 2020-07-08 ENCOUNTER — Ambulatory Visit: Payer: Medicaid Other | Admitting: Neurology

## 2020-07-08 ENCOUNTER — Other Ambulatory Visit: Payer: Self-pay

## 2020-07-08 VITALS — BP 136/98 | HR 94 | Ht 68.0 in | Wt 241.0 lb

## 2020-07-08 DIAGNOSIS — R202 Paresthesia of skin: Secondary | ICD-10-CM

## 2020-07-08 DIAGNOSIS — E538 Deficiency of other specified B group vitamins: Secondary | ICD-10-CM | POA: Diagnosis not present

## 2020-07-08 DIAGNOSIS — G6289 Other specified polyneuropathies: Secondary | ICD-10-CM

## 2020-07-08 DIAGNOSIS — M48061 Spinal stenosis, lumbar region without neurogenic claudication: Secondary | ICD-10-CM

## 2020-07-08 DIAGNOSIS — M792 Neuralgia and neuritis, unspecified: Secondary | ICD-10-CM | POA: Diagnosis not present

## 2020-07-08 NOTE — Patient Instructions (Addendum)
- Moderate spinal stenosis lumbar spine: [facility] may explain leg and feet symptoms as well as onset after falling on buttocks - EMG/NCS: Both hands and both legs are the same symptomatically, will test one arm and one leg to see if foot symptoms are of different etiology than hand symptoms - B12 has been low normal. Get Last B12 value from Dr. Carmelia Roller - requesting. Recheck - Labwork today - Pain management referral - She declines LP but encouraged it to look for elevated protein   Electromyoneurogram Electromyoneurogram is a test to check how well your muscles and nerves are working. This procedure includes the combined use of electromyogram (EMG) and nerve conduction study (NCS). EMG is used to look for muscular disorders. NCS, which is also called electroneurogram, measures how well your nerves are controlling your muscles. The procedures are usually done together to check if your muscles and nerves are healthy. If the results of the tests are abnormal, this may indicate disease or injury, such as a neuromuscular disease or peripheral nerve damage. Tell a health care provider about:  Any allergies you have.  All medicines you are taking, including vitamins, herbs, eye drops, creams, and over-the-counter medicines.  Any problems you or family members have had with anesthetic medicines.  Any blood disorders you have.  Any surgeries you have had.  Any medical conditions you have.  If you have a pacemaker.  Whether you are pregnant or may be pregnant. What are the risks? Generally, this is a safe procedure. However, problems may occur, including:  Infection where the electrodes were inserted.  Bleeding. What happens before the procedure? Medicines Ask your health care provider about:  Changing or stopping your regular medicines. This is especially important if you are taking diabetes medicines or blood thinners.  Taking medicines such as aspirin and ibuprofen.  These medicines can thin your blood. Do not take these medicines unless your health care provider tells you to take them.  Taking over-the-counter medicines, vitamins, herbs, and supplements. General instructions  Your health care provider may ask you to avoid: ? Beverages that have caffeine, such as coffee and tea. ? Any products that contain nicotine or tobacco. These products include cigarettes, e-cigarettes, and chewing tobacco. If you need help quitting, ask your health care provider.  Do not use lotions or creams on the same day that you will be having the procedure. What happens during the procedure? For EMG   Your health care provider will ask you to stay in a position so that he or she can access the muscle that will be studied. You may be standing, sitting, or lying down.  You may be given a medicine that numbs the area (local anesthetic).  A very thin needle that has an electrode will be inserted into your muscle.  Another small electrode will be placed on your skin near the muscle.  Your health care provider will ask you to continue to remain still.  The electrodes will send a signal that tells about the electrical activity of your muscles. You may see this on a monitor or hear it in the room.  After your muscles have been studied at rest, your health care provider will ask you to contract or flex your muscles. The electrodes will send a signal that tells about the electrical activity of your muscles.  Your health care provider will remove the electrodes and the electrode needles when the procedure is finished. The procedure may vary among health care providers and hospitals.  For NCS   An electrode that records your nerve activity (recording electrode) will be placed on your skin by the muscle that is being studied.  An electrode that is used as a reference (reference electrode) will be placed near the recording electrode.  A paste or gel will be applied to your skin  between the recording electrode and the reference electrode.  Your nerve will be stimulated with a mild shock. Your health care provider will measure how much time it takes for your muscle to react.  Your health care provider will remove the electrodes and the gel when the procedure is finished. The procedure may vary among health care providers and hospitals. What happens after the procedure?  It is up to you to get the results of your procedure. Ask your health care provider, or the department that is doing the procedure, when your results will be ready.  Your health care provider may: ? Give you medicines for any pain. ? Monitor the insertion sites to make sure that bleeding stops. Summary  Electromyoneurogram is a test to check how well your muscles and nerves are working.  If the results of the tests are abnormal, this may indicate disease or injury.  This is a safe procedure. However, problems may occur, such as bleeding and infection.  Your health care provider will do two tests to complete this procedure. One checks your muscles (EMG) and another checks your nerves (NCS).  It is up to you to get the results of your procedure. Ask your health care provider, or the department that is doing the procedure, when your results will be ready. This information is not intended to replace advice given to you by your health care provider. Make sure you discuss any questions you have with your health care provider. Document Revised: 07/25/2018 Document Reviewed: 07/07/2018 Elsevier Patient Education  2020 [employer].

## 2020-07-08 NOTE — Addendum Note (Signed)
Addended by: Naomie Dean B on: 07/08/2020 11:20 AM   Modules accepted: Orders

## 2020-07-09 ENCOUNTER — Other Ambulatory Visit: Payer: Self-pay | Admitting: Family Medicine

## 2020-07-09 DIAGNOSIS — F418 Other specified anxiety disorders: Secondary | ICD-10-CM

## 2020-07-09 DIAGNOSIS — M792 Neuralgia and neuritis, unspecified: Secondary | ICD-10-CM

## 2020-07-09 MED ORDER — LORAZEPAM 0.5 MG PO TABS: 0.5000 mg | ORAL_TABLET | Freq: Two times a day (BID) | ORAL | 0 refills | Status: DC | PRN

## 2020-07-09 NOTE — Progress Notes (Signed)
Lexey, There is mild to moderate anemia(improved from 5 months ago). Platelets are decreased but improved from 5 months ago.  Also your liver enzymes are mildly elevated(but again improved from 5 months ago). B12 is normal. Otherwise labs are normal. Still pending a few labs though. Thanks, Dr. Lucia Gaskins

## 2020-07-11 LAB — RHEUMATOID FACTOR: Rheumatoid fact SerPl-aCnc: <10 IU/mL (ref 0.0–13.9)

## 2020-07-11 LAB — COMPREHENSIVE METABOLIC PANEL
ALT: 44 IU/L — ABNORMAL HIGH (ref 0–32)
AST: 70 IU/L — ABNORMAL HIGH (ref 0–40)
Albumin/Globulin Ratio: 1.9 (ref 1.2–2.2)
Albumin: 4.1 g/dL (ref 3.8–4.8)
Alkaline Phosphatase: 80 IU/L (ref 48–121)
BUN/Creatinine Ratio: 22 (ref 9–23)
BUN: 11 mg/dL (ref 6–20)
Bilirubin Total: 0.4 mg/dL (ref 0.0–1.2)
CO2: 25 mmol/L (ref 20–29)
Calcium: 9.3 mg/dL (ref 8.7–10.2)
Chloride: 102 mmol/L (ref 96–106)
Creatinine, Ser: 0.5 mg/dL — ABNORMAL LOW (ref 0.57–1.00)
GFR calc Af Amer: 148 mL/min/{1.73_m2} (ref >59)
GFR calc non Af Amer: 128 mL/min/{1.73_m2} (ref >59)
Globulin, Total: 2.2 g/dL (ref 1.5–4.5)
Glucose: 89 mg/dL (ref 65–99)
Potassium: 4 mmol/L (ref 3.5–5.2)
Sodium: 138 mmol/L (ref 134–144)
Total Protein: 6.3 g/dL (ref 6.0–8.5)

## 2020-07-11 LAB — RPR: RPR Ser Ql: NONREACTIVE

## 2020-07-11 LAB — VITAMIN B1: Thiamine: 192.8 nmol/L (ref 66.5–200.0)

## 2020-07-11 LAB — HEMOGLOBIN A1C
Est. average glucose Bld gHb Est-mCnc: 97 mg/dL
Hgb A1c MFr Bld: 5 % (ref 4.8–5.6)

## 2020-07-11 LAB — METHYLMALONIC ACID, SERUM: Methylmalonic Acid: 142 nmol/L (ref 0–378)

## 2020-07-11 LAB — CBC
Hematocrit: 33.4 % — ABNORMAL LOW (ref 34.0–46.6)
Hemoglobin: 10.7 g/dL — ABNORMAL LOW (ref 11.1–15.9)
MCH: 27.5 pg (ref 26.6–33.0)
MCHC: 32 g/dL (ref 31.5–35.7)
MCV: 86 fL (ref 79–97)
Platelets: 124 10*3/uL — ABNORMAL LOW (ref 150–450)
RBC: 3.89 x10E6/uL (ref 3.77–5.28)
RDW: 17 % — ABNORMAL HIGH (ref 11.7–15.4)
WBC: 3.8 10*3/uL (ref 3.4–10.8)

## 2020-07-11 LAB — HIV ANTIBODY (ROUTINE TESTING W REFLEX): HIV Screen 4th Generation wRfx: NONREACTIVE

## 2020-07-11 LAB — HEAVY METALS, BLOOD
Arsenic: 2 ug/L (ref 2–23)
Lead, Blood: <1 ug/dL (ref 0–4)
Mercury: <1 ug/L (ref 0.0–14.9)

## 2020-07-11 LAB — VITAMIN B6: Vitamin B6: 85.3 ug/L — ABNORMAL HIGH (ref 2.0–32.8)

## 2020-07-11 LAB — ANA W/REFLEX: Anti Nuclear Antibody (ANA): NEGATIVE

## 2020-07-11 LAB — SEDIMENTATION RATE: Sed Rate: 8 mm/hr (ref 0–32)

## 2020-07-11 LAB — B12 AND FOLATE PANEL
Folate: 17.5 ng/mL (ref >3.0)
Vitamin B-12: 417 pg/mL (ref 232–1245)

## 2020-07-22 MED ORDER — LORAZEPAM 0.5 MG PO TABS: 0.5000 mg | ORAL_TABLET | Freq: Two times a day (BID) | ORAL | 0 refills | Status: DC | PRN

## 2020-07-22 NOTE — Addendum Note (Signed)
Addended by: Radene Gunning on: 07/22/2020 02:07 PM   Modules accepted: Orders

## 2020-07-31 ENCOUNTER — Telehealth: Payer: Self-pay | Admitting: *Deleted

## 2020-07-31 ENCOUNTER — Encounter: Payer: Medicaid Other | Admitting: Neurology

## 2020-07-31 NOTE — Telephone Encounter (Deleted)
Patient no showed her EMG this AM. This is her first no show at [facility].

## 2020-08-01 ENCOUNTER — Encounter: Payer: Medicaid Other | Admitting: Family Medicine

## 2020-08-11 ENCOUNTER — Ambulatory Visit: Payer: Medicaid Other | Admitting: Neurology

## 2020-08-11 ENCOUNTER — Other Ambulatory Visit: Payer: Self-pay

## 2020-08-11 ENCOUNTER — Encounter: Payer: Self-pay | Admitting: Neurology

## 2020-08-11 ENCOUNTER — Ambulatory Visit (INDEPENDENT_AMBULATORY_CARE_PROVIDER_SITE_OTHER): Payer: Medicaid Other | Admitting: Neurology

## 2020-08-11 DIAGNOSIS — R202 Paresthesia of skin: Secondary | ICD-10-CM | POA: Diagnosis not present

## 2020-08-11 DIAGNOSIS — M792 Neuralgia and neuritis, unspecified: Secondary | ICD-10-CM

## 2020-08-11 DIAGNOSIS — G63 Polyneuropathy in diseases classified elsewhere: Secondary | ICD-10-CM | POA: Diagnosis not present

## 2020-08-11 DIAGNOSIS — G6289 Other specified polyneuropathies: Secondary | ICD-10-CM | POA: Diagnosis not present

## 2020-08-11 DIAGNOSIS — E538 Deficiency of other specified B group vitamins: Secondary | ICD-10-CM

## 2020-08-11 DIAGNOSIS — Z0289 Encounter for other administrative examinations: Secondary | ICD-10-CM

## 2020-08-11 MED ORDER — CYANOCOBALAMIN 1000 MCG/ML IJ SOLN: 1000.0000 ug | INTRAMUSCULAR | 5 refills | Status: DC

## 2020-08-11 MED ORDER — CYANOCOBALAMIN 1000 MCG/ML IJ SOLN
1000.0000 ug | Freq: Once | INTRAMUSCULAR | Status: AC
  Administered 2020-08-11: 1000 ug via INTRAMUSCULAR

## 2020-08-11 MED ORDER — CYANOCOBALAMIN 1000 MCG/ML IJ SOLN
1000.0000 ug | INTRAMUSCULAR | 0 refills | Status: DC
Start: 2020-08-11 — End: 2022-09-28

## 2020-08-11 NOTE — Progress Notes (Signed)
See procedure note.

## 2020-08-11 NOTE — Progress Notes (Signed)
     Full Name: Julie Kennedy Gender: Female MRN #: [id number] Date of Birth: 03/29/1986    Visit Date: 08/11/2020 15:16 Age: 34 Years Examining Physician: Emin Foree, MD  Referring Physician: Wendling, Nicholas Paul, DO Height: 5 feet 8 inch    History: 34 year old with paresthesias in the feet in the setting of newly diagnosed B12 deficiency.  Summary: EMG/NCS performed on the bilateral lower extremities.  The right peroneal motor nerve showed decreased conduction velocity(fibular head to ankle, 37 m/s, normal greater than 44) and decreased conduction velocity (pop fossa to fibular head 38 m/s, normal greater than 44).  The right radial sensory nerve showed no response.  The right sural sensory nerve showed no response.  The right superficial peroneal sensory nerve showed no response.  The right median orthodromic sensory nerve showed no response.  The right ulnar orthodromic sensory nerve showed no response. All remaining nerves (as indicated in the following tables) were within normal limits.  All muscles (as indicated in the following tables) were within normal limits.     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  Likely due to B12 deficiency.  Jonny Longino M.D.  [facility] [address], [address] [place], [place] [address] Tel: [phone] Fax: [phone]  Verbal informed consent was obtained from the patient, patient was informed of potential risk of procedure, including bruising, bleeding, hematoma formation, infection, muscle weakness, muscle pain, numbness, among others.        MNC    Nerve / Sites Muscle Latency Ref. Amplitude Ref. Rel Amp Segments Distance Velocity Ref. Area    ms ms mV mV %  cm m/s m/s mVms  R Median - APB     Wrist APB 3.7 ?4.4 6.2 ?4.0 100 Wrist - APB 7   25.6     Upper arm APB 8.2  5.9  95.6 Upper arm - Wrist 22 49 ?49 25.9  R Ulnar - ADM     Wrist ADM 3.1 ?3.3  8.1 ?6.0 100 Wrist - ADM 7   25.9     B.Elbow ADM 7.0  6.9  85.3 B.Elbow - Wrist 19 49 ?49 25.3     A.Elbow ADM 9.0  6.6  95.8 A.Elbow - B.Elbow 10 49 ?49 23.9  R Peroneal - EDB     Ankle EDB 5.2 ?6.5 2.0 ?2.0 100 Ankle - EDB 9   8.3     Fib head EDB 12.8  1.5  78.6 Fib head - Ankle 28 37 ?44 6.8     Pop fossa EDB 15.4  1.6  106 Pop fossa - Fib head 10 38 ?44 6.8         Pop fossa - Ankle      R Tibial - AH     Ankle AH 4.8 ?5.8 6.6 ?4.0 100 Ankle - AH 9   19.2     Pop fossa AH 13.5  3.8  57.2 Pop fossa - Ankle 36 42 ?41 13.7             SNC    Nerve / Sites Rec. Site Peak Lat Ref.  Amp Ref. Segments Distance Peak Diff Ref.    ms ms V V  cm ms ms  R Radial - Anatomical snuff box (Forearm)     Forearm Wrist NR ?2.9 NR ?15 Forearm - Wrist 10    R Sural - Ankle (Calf)     Calf Ankle   NR ?4.4 NR ?6 Calf - Ankle 14    R Superficial peroneal - Ankle     Lat leg Ankle NR ?4.4 NR ?6 Lat leg - Ankle 14    R Median, Ulnar - Transcarpal comparison     Median Palm Wrist 2.2 ?2.2 4 ?35 Median Palm - Wrist 8       Ulnar Palm Wrist 5.2 ?2.2 9 ?12 Ulnar Palm - Wrist 8          Median Palm - Ulnar Palm  -3.0 ?0.4  R Median - Orthodromic (Dig II, Mid palm)     Dig II Wrist NR ?3.4 NR ?10 Dig II - Wrist 13    R Ulnar - Orthodromic, (Dig V, Mid palm)     Dig V Wrist NR ?3.1 NR ?5 Dig V - Wrist 2                   F  Wave    Nerve F Lat Ref.   ms ms  R Tibial - AH 55.9 ?56.0  R Ulnar - ADM 31.9 ?32.0         EMG Summary Table    Spontaneous MUAP Recruitment  Muscle IA Fib PSW Fasc Other Amp Dur. Poly Pattern  R. Extensor Hallucis Longus Normal None None None _______ Normal Normal Normal Normal  R. Vastus medialis Normal None None None _______ Normal Normal Normal Normal  R. Tibialis anterior Normal None None None _______ Normal Normal Normal Normal  R. Abductor hallucis Normal None None None _______ Normal Normal Normal Normal  R. Gastrocnemius (Medial head) Normal None None None _______  Normal Normal Normal Normal

## 2020-08-11 NOTE — Progress Notes (Signed)
Vitamin B12 1000 mcg IM administered in L deltoid per v.o. Dr Lucia Gaskins. Pt tolerated well.

## 2020-08-11 NOTE — Progress Notes (Addendum)
History:  Patient with acute onset paresthesias in the feet and hands in the setting of B12 deficiency. She also reported falling on her buttocks with a hx ofmoderately severe lumbar spinal stenosis. Reported severe burning, paresthesias, on multiple nerve pain medications. First the feet then developed symptoms in the hands. MRI of the brain and cervical spine unremarkable. MRI Lumbar spine with moderately severe stenosis. Exam shows trace reflexes (but she is guarding so difficult to assess) and Dysesthesia and allodynia of both hands and both feet. In January she had a fall on her buttocks but this would not explain the hand symptoms and progression to the hands. Symptoms started/progressed in February of this year. In March was diagnosed with B12 194. She Improved on B12. This is likely the diagnosis.   - B12 deficiency, B12 was194 six months ago can cause a sensory axonal peripheral neuropathy.  I had a long discussion with patient that this is likely B12 deficiency.  Other labs were unremarkable for alternative diagnosis including CMP, CBC, B6, heavy metals, rheumatoid factor, RPR, ANA, sed rate, HIV, B1, hemoglobin A1c, MMA, folate, Sjogren's.  Patient took a B12 supplements and her B12 increased to the 700s but then stopped taking specific supplements and B12 in our office a decreased again to 400s.  I advised patient to start injections.  I described symptoms of B12 deficiency and how important it is for her to monitor this.  Injected her today with B12 and sent injections to her home after which time she should take B12 oral 1000 daily and follow with primary care.  We will also check antibodies to B12 absorption. - Patient diagnosed with B12 deficiency at the onset of the symptoms: 194, 6 months ago. 708, 5 months ago. 417, 1 month ago. Mother also with b12 deficiency.  - Moderate spinal stenosis lumbar spine: Would not explain her hand symptoms, however we can refer to [facility] for  evaluation given moderate stenosis.  She can wait and see if B12 stabilizes her symptoms before going. -Patient's mother also has B12 deficiency this may be genetic follow-up with primary care. - answered all questions - B6 elevated please do not take more than 100mg  daily of B6., mega doses can cause neuronopathy  Meds ordered this encounter  Medications  . cyanocobalamin (,VITAMIN B-12,) 1000 MCG/ML injection    Sig: Inject 1 mL (1,000 mcg total) into the muscle once a week. Weekly for 4 weeks. Then monthly for 6 months.    Dispense:  4 mL    Refill:  0    Please provide 47ml syring and a 23 g 1inch needle for every injection.  . cyanocobalamin (,VITAMIN B-12,) 1000 MCG/ML injection    Sig: Inject 1 mL (1,000 mcg total) into the muscle every 30 (thirty) days. Start after weekly injections completed.Continue for 6 months    Dispense:  1 mL    Refill:  5    Please provide 72ml syring and a 23 g 1inch needle for every injection.  . cyanocobalamin ((VITAMIN B-12)) injection 1,000 mcg   Orders Placed This Encounter  Procedures  . Intrinsic Factor Antibodies  . Anti-parietal antibody    I spent 30 minutes of face-to-face and non-face-to-face time with patient on the  1. Polyneuropathy associated with underlying disease (HCC)   2. B12 deficiency   3. Paresthesias   4. Other polyneuropathy   5. Neuropathic pain    diagnosis.  This included previsit chart review, lab review, study review, order entry, electronic  health record documentation, patient education on the different diagnostic and therapeutic options, counseling and coordination of care, risks and benefits of management, compliance, or risk factor reduction. This does not include time spent on emg/ncs.

## 2020-08-11 NOTE — Patient Instructions (Addendum)
Inject B12 today then starting in one week inject weekly for 4 more weeks Then monthly for 6 more months Then Take oral B12 daily 1000-2082mcg or may continue monthly injections follow up with primary care.   Vitamin B12 Deficiency Vitamin B12 deficiency occurs when the body does not have enough vitamin B12, which is an important vitamin. The body needs this vitamin:  To make red blood cells.  To make DNA. This is the genetic material inside cells.  To help the nerves work properly so they can carry messages from the brain to the body. Vitamin B12 deficiency can cause various health problems, such as a low red blood cell count (anemia) or nerve damage. What are the causes? This condition may be caused by:  Not eating enough foods that contain vitamin B12.  Not having enough stomach acid and digestive fluids to properly absorb vitamin B12 from the food that you eat.  Certain digestive system diseases that make it hard to absorb vitamin B12. These diseases include Crohn's disease, chronic pancreatitis, and cystic fibrosis.  A condition in which the body does not make enough of a protein (intrinsic factor), resulting in too few red blood cells (pernicious anemia).  Having a surgery in which part of the stomach or small intestine is removed.  Taking certain medicines that make it hard for the body to absorb vitamin B12. These medicines include: ? Heartburn medicines (antacids and proton pump inhibitors). ? Certain antibiotic medicines. ? Some medicines that are used to treat diabetes, tuberculosis, gout, or high cholesterol. What increases the risk? The following factors may make you more likely to develop a B12 deficiency:  Being older than age 5.  Eating a vegetarian or vegan diet, especially while you are pregnant.  Eating a poor diet while you are pregnant.  Taking certain medicines.  Having alcoholism. What are the signs or symptoms? In some cases, there are no symptoms  of this condition. If the condition leads to anemia or nerve damage, various symptoms can occur, such as:  Weakness.  Fatigue.  Loss of appetite.  Weight loss.  Numbness or tingling in your hands and feet.  Redness and burning of the tongue.  Confusion or memory problems.  Depression.  Sensory problems, such as color blindness, ringing in the ears, or loss of taste.  Diarrhea or constipation.  Trouble walking. If anemia is severe, symptoms can include:  Shortness of breath.  Dizziness.  Rapid heart rate (tachycardia). How is this diagnosed? This condition may be diagnosed with a blood test to measure the level of vitamin B12 in your blood. You may also have other tests, including:  A group of tests that measure certain characteristics of blood cells (complete blood count, CBC).  A blood test to measure intrinsic factor.  A procedure where a thin tube with a camera on the end is used to look into your stomach or intestines (endoscopy). Other tests may be needed to discover the cause of B12 deficiency. How is this treated? Treatment for this condition depends on the cause. This condition may be treated by:  Changing your eating and drinking habits, such as: ? Eating more foods that contain vitamin B12. ? Drinking less alcohol or no alcohol.  Getting vitamin B12 injections.  Taking vitamin B12 supplements. Your health care provider will tell you which dosage is best for you. Follow these instructions at home: Eating and drinking   Eat lots of healthy foods that contain vitamin B12, including: ? Meats and poultry.  This includes beef, pork, chicken, [place], and organ meats, such as liver. ? Seafood. This includes clams, rainbow trout, salmon, tuna, and haddock. ? Eggs. ? Cereal and dairy products that are fortified. This means that vitamin B12 has been added to the food. Check the label on the package to see if the food is fortified. The items listed above may not  be a complete list of recommended foods and beverages. Contact a dietitian for more information. General instructions  Get any injections that are prescribed by your health care provider.  Take supplements only as told by your health care provider. Follow the directions carefully.  Do not drink alcohol if your health care provider tells you not to. In some cases, you may only be asked to limit alcohol use.  Keep all follow-up visits as told by your health care provider. This is important. Contact a health care provider if:  Your symptoms come back. Get help right away if you:  Develop shortness of breath.  Have a rapid heart rate.  Have chest pain.  Become dizzy or lose consciousness. Summary  Vitamin B12 deficiency occurs when the body does not have enough vitamin B12.  The main causes of vitamin B12 deficiency include dietary deficiency, digestive diseases, pernicious anemia, and having a surgery in which part of the stomach or small intestine is removed.  In some cases, there are no symptoms of this condition. If the condition leads to anemia or nerve damage, various symptoms can occur, such as weakness, shortness of breath, and numbness.  Treatment may include getting vitamin B12 injections or taking vitamin B12 supplements. Eat lots of healthy foods that contain vitamin B12. This information is not intended to replace advice given to you by your health care provider. Make sure you discuss any questions you have with your health care provider. Document Revised: 04/27/2019 Document Reviewed: 07/18/2018 Elsevier Patient Education  2020 [employer].

## 2020-08-12 ENCOUNTER — Encounter: Payer: Self-pay | Admitting: Physical Medicine and Rehabilitation

## 2020-08-12 ENCOUNTER — Encounter: Payer: Medicaid Other | Admitting: Physical Medicine and Rehabilitation

## 2020-08-12 LAB — INTRINSIC FACTOR ANTIBODIES: Intrinsic Factor Abs, Serum: 17.6 AU/mL — ABNORMAL HIGH (ref 0.0–1.1)

## 2020-08-12 LAB — ANTI-PARIETAL ANTIBODY: Parietal Cell Ab: 14.5 Units (ref 0.0–20.0)

## 2020-08-12 NOTE — Procedures (Signed)
Full Name: Julie Kennedy Gender: Female MRN #: [id number] Date of Birth: 09/07/1986    Visit Date: 08/11/2020 15:16 Age: 34 Years Examining Physician: Naomie Dean, MD  Referring Physician: Sharlene Dory, DO Height: 5 feet 8 inch    History: 34 year old with paresthesias in the feet in the setting of newly diagnosed B12 deficiency.  Summary: EMG/NCS performed on the bilateral lower extremities.  The right peroneal motor nerve showed decreased conduction velocity(fibular head to ankle, 37 m/s, normal greater than 44) and decreased conduction velocity (pop fossa to fibular head 38 m/s, normal greater than 44).  The right radial sensory nerve showed no response.  The right sural sensory nerve showed no response.  The right superficial peroneal sensory nerve showed no response.  The right median orthodromic sensory nerve showed no response.  The right ulnar orthodromic sensory nerve showed no response. All remaining nerves (as indicated in the following tables) were within normal limits.  All muscles (as indicated in the following tables) were within normal limits.     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  Likely due to B12 deficiency.  Naomie Dean M.D.  [facility] [address] [place], [place] [address] Tel: [phone] Fax: [phone]  Verbal informed consent was obtained from the patient, patient was informed of potential risk of procedure, including bruising, bleeding, hematoma formation, infection, muscle weakness, muscle pain, numbness, among others.        MNC    Nerve / Sites Muscle Latency Ref. Amplitude Ref. Rel Amp Segments Distance Velocity Ref. Area    ms ms mV mV %  cm m/s m/s mVms  R Median - APB     Wrist APB 3.7 ?4.4 6.2 ?4.0 100 Wrist - APB 7   25.6     Upper arm APB 8.2  5.9  95.6 Upper arm - Wrist 22 49 ?49 25.9  R Ulnar - ADM     Wrist ADM 3.1 ?3.3  8.1 ?6.0 100 Wrist - ADM 7   25.9     B.Elbow ADM 7.0  6.9  85.3 B.Elbow - Wrist 19 49 ?49 25.3     A.Elbow ADM 9.0  6.6  95.8 A.Elbow - B.Elbow 10 49 ?49 23.9  R Peroneal - EDB     Ankle EDB 5.2 ?6.5 2.0 ?2.0 100 Ankle - EDB 9   8.3     Fib head EDB 12.8  1.5  78.6 Fib head - Ankle 28 37 ?44 6.8     Pop fossa EDB 15.4  1.6  106 Pop fossa - Fib head 10 38 ?44 6.8         Pop fossa - Ankle      R Tibial - AH     Ankle AH 4.8 ?5.8 6.6 ?4.0 100 Ankle - AH 9   19.2     Pop fossa AH 13.5  3.8  57.2 Pop fossa - Ankle 36 42 ?41 13.7             SNC    Nerve / Sites Rec. Site Peak Lat Ref.  Amp Ref. Segments Distance Peak Diff Ref.    ms ms V V  cm ms ms  R Radial - Anatomical snuff box (Forearm)     Forearm Wrist NR ?2.9 NR ?15 Forearm - Wrist 10    R Sural - Ankle (Calf)     Calf Ankle  NR ?4.4 NR ?6 Calf - Ankle 14    R Superficial peroneal - Ankle     Lat leg Ankle NR ?4.4 NR ?6 Lat leg - Ankle 14    R Median, Ulnar - Transcarpal comparison     Median Palm Wrist 2.2 ?2.2 4 ?35 Median Palm - Wrist 8       Ulnar Palm Wrist 5.2 ?2.2 9 ?12 Ulnar Palm - Wrist 8          Median Palm - Ulnar Palm  -3.0 ?0.4  R Median - Orthodromic (Dig II, Mid palm)     Dig II Wrist NR ?3.4 NR ?10 Dig II - Wrist 13    R Ulnar - Orthodromic, (Dig V, Mid palm)     Dig V Wrist NR ?3.1 NR ?5 Dig V - Wrist 2                   F  Wave    Nerve F Lat Ref.   ms ms  R Tibial - AH 55.9 ?56.0  R Ulnar - ADM 31.9 ?32.0         EMG Summary Table    Spontaneous MUAP Recruitment  Muscle IA Fib PSW Fasc Other Amp Dur. Poly Pattern  R. Extensor Hallucis Longus Normal None None None _______ Normal Normal Normal Normal  R. Vastus medialis Normal None None None _______ Normal Normal Normal Normal  R. Tibialis anterior Normal None None None _______ Normal Normal Normal Normal  R. Abductor hallucis Normal None None None _______ Normal Normal Normal Normal  R. Gastrocnemius (Medial head) Normal None None None _______  Normal Normal Normal Normal

## 2020-08-13 ENCOUNTER — Telehealth: Payer: Self-pay | Admitting: *Deleted

## 2020-08-13 NOTE — Telephone Encounter (Signed)
-----   Message from Anson Fret, MD sent at 08/12/2020  6:43 PM EDT ----- Toma Copier: Patient's intrinsic factor antibodies are elevated significantly to 17.6 and this may be why she has B12 deficiency.  I would follow-up with Dr. Carmelia Roller for further evaluation (if any) and consideration of long-term B12 injections.  Dr. Liana Gerold.

## 2020-08-13 NOTE — Telephone Encounter (Signed)
I called the pt and LVM (ok per DPR) advising the patient that her intrinsic factor antibodies are elevated significantly at 17.6 and normal is 0-1.1. I advised per Dr Lucia Gaskins to see Dr Carmelia Roller for further evaluation (if any) and to consider long term B12 injections. Left office number for call back if she has any questions. Also advised I would send a message through mychart and requested a response to confirm receipt of message.

## 2020-08-18 NOTE — Telephone Encounter (Signed)
I spoke with Julie Kennedy at [facility]. He stated there was no order for injections/needles. I let him know we had placed the request in the prescription but can send separate order if needed. Elio took a verbal order for 3 mL syringe and 23 gauge 1 inch needles x 4 one time to cover the B12 pt already picked up. Separate order placed for 1 syringe & needle with 4 refills to cover the remaining monthly B12 pt will be filling.

## 2020-09-12 ENCOUNTER — Other Ambulatory Visit: Payer: Self-pay

## 2020-09-12 ENCOUNTER — Encounter: Payer: Self-pay | Admitting: Physical Medicine and Rehabilitation

## 2020-09-12 ENCOUNTER — Encounter
Payer: Medicaid Other | Attending: Physical Medicine and Rehabilitation | Admitting: Physical Medicine and Rehabilitation

## 2020-09-12 VITALS — BP 139/93 | HR 95 | Temp 98.2°F | Ht 68.0 in | Wt 252.2 lb

## 2020-09-12 DIAGNOSIS — G63 Polyneuropathy in diseases classified elsewhere: Secondary | ICD-10-CM | POA: Insufficient documentation

## 2020-09-12 DIAGNOSIS — M792 Neuralgia and neuritis, unspecified: Secondary | ICD-10-CM | POA: Insufficient documentation

## 2020-09-12 DIAGNOSIS — E538 Deficiency of other specified B group vitamins: Secondary | ICD-10-CM | POA: Diagnosis not present

## 2020-09-12 DIAGNOSIS — T50905A Adverse effect of unspecified drugs, medicaments and biological substances, initial encounter: Secondary | ICD-10-CM | POA: Insufficient documentation

## 2020-09-12 DIAGNOSIS — R635 Abnormal weight gain: Secondary | ICD-10-CM | POA: Diagnosis not present

## 2020-09-12 MED ORDER — DULOXETINE HCL 20 MG PO CPEP: 20.0000 mg | ORAL_CAPSULE | Freq: Every day | ORAL | 0 refills | Status: DC

## 2020-09-12 NOTE — Patient Instructions (Signed)
-   Foods that may reduce pain: 1) Ginger 2) Blueberries 3) Salmon 4) Pumpkin seeds 5) dark chocolate 6) turmeric 7) tart cherries 8) virgin olive oil 9) chilli peppers 10) mint 11) red wine  Ffoods that can assist in weight loss:  1) Eggs  2) Leafy greens  3) Salmon  4) Cruciferous vegetables  5) Lean beef and chicken breast  6) Boiled potatoes  7) Tuna  8) Beans and legumes  9) Soups  10) Cottage cheese  11) Avocados  12) Apple cider vinegar  13) Nuts  14) Whole grains  15) Chili pepper  16) Fruit- berries are some of the best  17) Grapefruit  18) Chia seeds  19) Coconut oil  20) Full-fat yogurt  -

## 2020-09-12 NOTE — Progress Notes (Signed)
Subjective:    Patient ID: Julie Kennedy, female    DOB: 1986/05/26, 34 y.o.   MRN: [id number]  HPI  Julie Kennedy is a 34 year old woman who presents with severe neuropathy since April. It is affecting her mental health because she is limited in self-care. She saw multiple neurologists and was diagnosed with stenosis and B12 deficiency.   B12 injections once per week- mom is administering. She has not yet noted a difference.   Her symptoms are improved with Lyrica. The Amitriptyline does not   Average pain is 6/10 and current pain is 6/10.  She has a 2 year old daughter. Has been unable to work.   Pain Inventory Average Pain 6 Pain Right Now 6 My pain is constant, sharp, burning, tingling and aching  In the last 24 hours, has pain interfered with the following? General activity 6 Relation with others 5 Enjoyment of life 10 What TIME of day is your pain at its worst? night Sleep (in general) Poor  Pain is worse with: walking, bending and standing Pain improves with: Nothing helps. Relief from Meds: 3  how many minutes can you walk? As needed. ability to climb steps?  yes do you drive?  yes Do you have any goals in this area?  yes  I need assistance with the following:  meal prep and household duties Do you have any goals in this area?  yes  weakness numbness tingling trouble walking anxiety  New Patient  New Patient    Family History  Problem Relation Age of Onset  . Arthritis Mother   . Depression Mother   . Hypertension Mother   . Neuropathy Neg Hx    Social History   Socioeconomic History  . Marital status: Significant Other    Spouse name: Not on file  . Number of children: 1  . Years of education: Not on file  . Highest education level: Not on file  Occupational History  . Not on file  Tobacco Use  . Smoking status: Never Smoker  . Smokeless tobacco: Never Used  Vaping Use  . Vaping Use: Never used  Substance and Sexual Activity  .  Alcohol use: Not Currently    Comment: was casually   . Drug use: Never    Comment: pt denies a history, but cocaine listed from previous visit   . Sexual activity: Not Currently  Other Topics Concern  . Not on file  Social History Narrative   Lives at home with boyfriend and daughter    Right handed   Caffeine: seldom    Social Determinants of Health   Financial Resource Strain:   . Difficulty of Paying Living Expenses: Not on file  Food Insecurity:   . Worried About Programme researcher, broadcasting/film/video in the Last Year: Not on file  . Ran Out of Food in the Last Year: Not on file  Transportation Needs:   . Lack of Transportation (Medical): Not on file  . Lack of Transportation (Non-Medical): Not on file  Physical Activity:   . Days of Exercise per Week: Not on file  . Minutes of Exercise per Session: Not on file  Stress:   . Feeling of Stress : Not on file  Social Connections:   . Frequency of Communication with Friends and Family: Not on file  . Frequency of Social Gatherings with Friends and Family: Not on file  . Attends Religious Services: Not on file  . Active Member of Clubs or  Organizations: Not on file  . Attends Banker Meetings: Not on file  . Marital Status: Not on file   Past Surgical History:  Procedure Laterality Date  . CESAREAN SECTION    . CHOLECYSTECTOMY N/A 07/07/2018   Procedure: LAPAROSCOPIC CHOLECYSTECTOMY WITH INTRAOPERATIVE CHOLANGIOGRAM ERAS PATHWAY;  Surgeon: Griselda Miner, MD;  Location: [facility];  Service: General;  Laterality: N/A;  . TONSILLECTOMY AND ADENOIDECTOMY     Past Medical History:  Diagnosis Date  . Anxiety   . Depression   . Frequent headaches   . Hard of hearing   . Hypertension    BP (!) 139/93   Pulse 95   Temp 98.2 F (36.8 C)   Ht 5\' 8"  (1.727 m)   Wt 252 lb 3.2 oz (114.4 kg)   SpO2 99%   BMI 38.35 kg/m   Opioid Risk Score:   Fall Risk Score:  `1  Depression screen PHQ 2/9  Depression screen PHQ 2/9 09/12/2020    Decreased Interest 1  Down, Depressed, Hopeless 0  PHQ - 2 Score 1  Altered sleeping 3  Tired, decreased energy 2  Change in appetite 1  Feeling bad or failure about yourself  0  Trouble concentrating 0  Moving slowly or fidgety/restless 1  Suicidal thoughts 0  PHQ-9 Score 8   Review of Systems  Constitutional: Negative.   HENT: Negative.   Eyes: Negative.   Respiratory: Negative.   Cardiovascular: Negative.   Gastrointestinal: Negative.   Endocrine: Negative.   Genitourinary: Negative.   Musculoskeletal: Positive for gait problem.       Hands feet & lower back pain  Skin: Negative.   Allergic/Immunologic: Negative.   Neurological: Positive for numbness.       Tingling & burning  Hematological: Negative.   Psychiatric/Behavioral: Negative.   All other systems reviewed and are negative.      Objective:   Physical Exam Gen: no distress, normal appearing HEENT: oral mucosa pink and moist, NCAT Cardio: Reg rate Chest: normal effort, normal rate of breathing Abd: soft, non-distended Ext: no edema Skin: intact Neuro: Alert and oriented x3. Hard of hearing.  Musculoskeletal: 5/5 strength throughout. Decreased sensation on ventral and dorsal aspects of her feet. Decreased sensation both hands.  Psych: pleasant, normal affect     Assessment & Plan:  Julie Kennedy is a 34 year old woman who presents to establish care for neuropathy.   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 -Continue weekly B12 injections.  -Lumbar MRI reviewed: 1. L4-5 disc protrusion resulting in moderate spinal stenosis and left greater than right lateral recess stenosis. 2. L5-S1 disc protrusion with right lateral recess stenosis.  -Pain is worse on right side.  -Most pain is present in the hands and feet.  --Lumbar transforaminal ESI is indicated for the following levels: L4-L5 and  L5-S1. The patient has low back pain; lower extremity pain; paresthesias; increased pain with coughing, sneezing, and/or straining; lower extremity weakness; and gait disturbance. The patient has no bowel or bladder dysfunction or saddle anesthesia. The patient has failed 6 months of conservative care with physical therapy and HEP, medications, activity and lifestyle modification, and TENS. MRI reviewed as above. The patient has never had ESI before.   Weight gain: May be secondary to Lyrica -Has gained 30 lbs since April.  -BMI is 38.35 and weight is 252 lbs.  -dsicussed time restricted  eating.   1 hour spent in discussion of symptoms, review of her records, discussions of her medications and imaging, discussion of impact on her function, physical examination, discussion of ESI, discussion of weight gain and foods she can eat to relieve pain and reverse weight gain

## 2020-10-10 ENCOUNTER — Encounter: Payer: Medicaid Other | Attending: Physical Medicine and Rehabilitation | Admitting: Physical Medicine & Rehabilitation

## 2020-10-10 ENCOUNTER — Other Ambulatory Visit: Payer: Self-pay

## 2020-10-10 ENCOUNTER — Encounter: Payer: Self-pay | Admitting: Physical Medicine & Rehabilitation

## 2020-10-10 VITALS — BP 136/94 | HR 93 | Temp 97.7°F | Ht 68.0 in | Wt 251.0 lb

## 2020-10-10 DIAGNOSIS — M5442 Lumbago with sciatica, left side: Secondary | ICD-10-CM | POA: Insufficient documentation

## 2020-10-10 DIAGNOSIS — G8929 Other chronic pain: Secondary | ICD-10-CM | POA: Insufficient documentation

## 2020-10-10 DIAGNOSIS — M5441 Lumbago with sciatica, right side: Secondary | ICD-10-CM | POA: Diagnosis not present

## 2020-10-10 NOTE — Progress Notes (Signed)
Left L4-5 and Right L5-S1 Lumbar transforaminal epidural steroid injection under fluoroscopic guidance with contrast enhancement  Indication: Lumbosacral radiculitis is not relieved by medication management or other conservative care and interfering with self-care and mobility.   Informed consent was obtained after describing risk and benefits of the procedure with the patient, this includes bleeding, bruising, infection, paralysis and medication side effects.  The patient wishes to proceed and has given written consent.  Patient was placed in prone position.  The lumbar area was marked and prepped with Betadine.  It was entered with a 25-gauge 1-1/2 inch needle and one mL of 1% lidocaine was injected into the skin and subcutaneous tissue initially at Left L3-4 but this was changed to Left L4-5 paraspinal area, also RIghtL5-S1 was infiltrated .  Then a 22-gauge 5" spinal needle was inserted into the Left 4-5  intervertebral foramen under AP, lateral, and oblique view.  Once needle tip was within the foramen on lateral views an dnor exceeding 6 o clock position on th epedical on AP viewed Isovue 200 was inected x 84ml Then a solution containing one mL of 10 mg per mL dexamethasone and 2 mL of 1% lidocaine was injected. THis same procedure was repeated at Right L5-S1 The patient tolerated procedure well.  Post procedure instructions were given.  Please see post procedure form.  Pt will f/u with Dr Carlis Abbott

## 2020-10-10 NOTE — Progress Notes (Signed)
  PROCEDURE RECORD [facility]   Name: Julie Kennedy DOB:June 25, 1986 MRN: [id number]  Date:10/10/2020  Physician: Claudette Laws, MD    Nurse/CMA: Charise Carwin MA  Allergies:  Allergies  Allergen Reactions  . Percocet [Oxycodone-Acetaminophen] Other (See Comments)    Hallucinations    Consent Signed: Yes.    Is patient diabetic? No.  CBG today? N/A  Pregnant: No. LMP: No LMP recorded. (age 32-55)  Anticoagulants: no Anti-inflammatory: no Antibiotics: no  Procedure: Bilateral Transforaminal ESI L4, L5 Position: Prone Start Time: 11:11AM End Time: 11:25AM Fluoro Time: 58  RN/CMA Brileigh Sevcik MA Butch Otterson MA    Time 10:45 11:31AM    BP 136/94 149/91    Pulse 93 92    Respirations 14 16    O2 Sat 98 98    S/S 6 6    Pain Level 5/10 3/10     D/C home with Margo Aye    , patient A & O X 3, D/C instructions reviewed, and sits independently.

## 2020-10-10 NOTE — Patient Instructions (Signed)

## 2021-02-15 ENCOUNTER — Telehealth: Payer: Self-pay | Admitting: Neurology

## 2021-02-15 NOTE — Telephone Encounter (Signed)
Patient has covid. Would one of you cancel her appointment at 11am on Monday and call her for a follow up? She can see Amy or megan in a fe months thanks

## 2021-02-16 ENCOUNTER — Telehealth: Payer: Self-pay | Admitting: Neurology

## 2021-02-16 ENCOUNTER — Ambulatory Visit: Payer: Medicaid Other | Admitting: Neurology

## 2021-02-16 NOTE — Telephone Encounter (Signed)
PT appt was cancelled today with Dr. Lucia Gaskins because pt has covid, rescheduled her with [facility], per Dr. Lucia Gaskins.

## 2021-03-11 ENCOUNTER — Ambulatory Visit: Payer: Medicaid Other | Admitting: Adult Health

## 2021-03-28 ENCOUNTER — Other Ambulatory Visit: Payer: Self-pay | Admitting: Neurology

## 2021-05-09 ENCOUNTER — Other Ambulatory Visit: Payer: Self-pay | Admitting: Neurology

## 2021-05-27 ENCOUNTER — Other Ambulatory Visit: Payer: Self-pay | Admitting: Neurology

## 2021-05-27 ENCOUNTER — Other Ambulatory Visit: Payer: Self-pay

## 2021-05-27 DIAGNOSIS — E538 Deficiency of other specified B group vitamins: Secondary | ICD-10-CM

## 2022-07-05 ENCOUNTER — Telehealth: Payer: Self-pay | Admitting: Neurology

## 2022-07-05 NOTE — Telephone Encounter (Signed)
Pt called stating that due to her insurance change to [employer] with [facility] she is needing to be referred to another Neurologist in network. Pt would like to know how she can go about getting referred to another office.

## 2022-07-05 NOTE — Telephone Encounter (Signed)
I spoke to pt and she will contact her pcp to address who may be good for her to see, she mentioned [facility].  Again referred to pcp who is in same network and may be able to giver her more info on certain providers.  She appreciated call back.

## 2022-08-18 ENCOUNTER — Ambulatory Visit: Payer: Medicaid Other | Admitting: Neurology

## 2022-09-24 ENCOUNTER — Telehealth: Payer: Self-pay | Admitting: Neurology

## 2022-09-24 NOTE — Telephone Encounter (Signed)
..   Pt understands that although there may be some limitations with this type of visit, we will take all precautions to reduce any security or privacy concerns.  Pt understands that this will be treated like an in office visit and we will file with pt's insurance, and there may be a patient responsible charge related to this service. ? ?

## 2022-09-27 ENCOUNTER — Telehealth: Payer: Self-pay | Admitting: Neurology

## 2022-09-27 ENCOUNTER — Encounter: Payer: Self-pay | Admitting: Neurology

## 2022-09-27 ENCOUNTER — Telehealth (INDEPENDENT_AMBULATORY_CARE_PROVIDER_SITE_OTHER): Payer: Medicaid Other | Admitting: Neurology

## 2022-09-27 DIAGNOSIS — D510 Vitamin B12 deficiency anemia due to intrinsic factor deficiency: Secondary | ICD-10-CM

## 2022-09-27 DIAGNOSIS — G8929 Other chronic pain: Secondary | ICD-10-CM

## 2022-09-27 DIAGNOSIS — E538 Deficiency of other specified B group vitamins: Secondary | ICD-10-CM | POA: Diagnosis not present

## 2022-09-27 DIAGNOSIS — M48062 Spinal stenosis, lumbar region with neurogenic claudication: Secondary | ICD-10-CM | POA: Diagnosis not present

## 2022-09-27 DIAGNOSIS — M5416 Radiculopathy, lumbar region: Secondary | ICD-10-CM

## 2022-09-27 DIAGNOSIS — G609 Hereditary and idiopathic neuropathy, unspecified: Secondary | ICD-10-CM

## 2022-09-27 DIAGNOSIS — G63 Polyneuropathy in diseases classified elsewhere: Secondary | ICD-10-CM

## 2022-09-27 DIAGNOSIS — M545 Low back pain, unspecified: Secondary | ICD-10-CM

## 2022-09-27 MED ORDER — DULOXETINE HCL 60 MG PO CPEP: 60.0000 mg | ORAL_CAPSULE | Freq: Every day | ORAL | 6 refills | Status: DC

## 2022-09-27 MED ORDER — GABAPENTIN 300 MG PO CAPS: 600.0000 mg | ORAL_CAPSULE | Freq: Every day | ORAL | 4 refills | Status: DC

## 2022-09-27 MED ORDER — CYANOCOBALAMIN 1000 MCG/ML IJ SOLN: 1000.0000 ug | INTRAMUSCULAR | 5 refills | Status: DC

## 2022-09-27 NOTE — Telephone Encounter (Signed)
Called pt, scheduled for video visit 09/28/23 at 11:00 am.

## 2022-09-27 NOTE — Telephone Encounter (Signed)
Order form signed by Dr Jaynee Eagles. Tip sheet for pharmacy mailed to pt at home address on file. Form and insurance info faxed to [facility]. Received a receipt of confirmation.  Compound: amantadine 8%, baclofen 2%, gabapentin 8%, amitriptyline 4%, bupivacaine 2%, clonidine 0.2%, and nifedipine 2%.   Apply 1-2 grams to the affected area 3-4 times daily. Dispense 240 grams with 5 refills.

## 2022-09-27 NOTE — Telephone Encounter (Signed)
Please call A year video unless worse she can call with dr Jaynee Eagles

## 2022-09-27 NOTE — Patient Instructions (Addendum)
- Patient last seen 2021. B12 194, oral not working. 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 She is taking oral b12 without improvement and the injections were better. Will send b12 injections to home they helped a lot. She is not getting worse, so likely B12 is the right diagnosis.  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   Moderate spinal stenosis lumbar spine: That may be the cause of some of her leg symptoms. Her bacl is very sore, she went to PT, conservative measures did not help. Repeat MRI lumbar spine, has neurogenic claudication, need to follow   Pain: Compounding cream, increase cymbalta, get start gabapentin at bedtime.   Meds ordered this encounter  Medications   cyanocobalamin (VITAMIN B12) 1000 MCG/ML injection    Sig: Inject 1 mL (1,000 mcg total) into the muscle every 30 (thirty) days.    Dispense:  1 mL    Refill:  5    Please provide 37ml syring and a 23 g 1inch needle for every injection.   DULoxetine (CYMBALTA) 60 MG capsule    Sig: Take 1 capsule (60 mg total) by mouth daily.    Dispense:  90 capsule    Refill:  6   gabapentin (NEURONTIN) 300 MG capsule    Sig: Take 2-3 capsules (600-900 mg total) by mouth at bedtime.    Dispense:  90 capsule    Refill:  4   Orders Placed This Encounter  Procedures   MR LUMBAR SPINE WO CONTRAST   Vitamin B12   Methylmalonic acid, serum    Gabapentin Capsules or Tablets What is this medication? GABAPENTIN (GA ba pen tin) treats nerve pain. It may also be used to prevent and control seizures in people with epilepsy. It works by calming overactive nerves in your body. This medicine may be used for other purposes; ask your health care provider or pharmacist if you have questions. COMMON BRAND NAME(S): Active-PAC with Gabapentin, Orpha Bur, Gralise, Neurontin What should I tell my  care team before I take this medication? They need to know if you have any of these conditions: Alcohol or substance use disorder Kidney disease Lung or breathing disease Suicidal thoughts, plans, or attempt; a previous suicide attempt by you or a family member An unusual or allergic reaction to gabapentin, other medications, foods, dyes, or preservatives Pregnant or trying to get pregnant Breast-feeding How should I use this medication? Take this medication by mouth with a glass of water. Follow the directions on the prescription label. You can take it with or without food. If it upsets your stomach, take it with food. Take your medication at regular intervals. Do not take it more often than directed. Do not stop taking except on your care team's advice. If you are directed to break the 600 or 800 mg tablets in half as part of your dose, the extra half tablet should be used for the next dose. If you have not used the extra half tablet within 28 days, it should be thrown away. A special MedGuide will be given to you by the pharmacist with each prescription and refill. Be sure to read this information carefully each time. Talk to your care team about the use of this medication in children. While this medication may be prescribed for children as young as 3 years for selected conditions, precautions do apply. Overdosage: If you think you have  taken too much of this medicine contact a poison control center or emergency room at once. NOTE: This medicine is only for you. Do not share this medicine with others. What if I miss a dose? If you miss a dose, take it as soon as you can. If it is almost time for your next dose, take only that dose. Do not take double or extra doses. What may interact with this medication? Alcohol Antihistamines for allergy, cough, and cold Certain medications for anxiety or sleep Certain medications for depression like amitriptyline, fluoxetine, sertraline Certain  medications for seizures like phenobarbital, primidone Certain medications for stomach problems General anesthetics like halothane, isoflurane, methoxyflurane, propofol Local anesthetics like lidocaine, pramoxine, tetracaine Medications that relax muscles for surgery Opioid medications for pain Phenothiazines like chlorpromazine, mesoridazine, prochlorperazine, thioridazine This list may not describe all possible interactions. Give your health care provider a list of all the medicines, herbs, non-prescription drugs, or dietary supplements you use. Also tell them if you smoke, drink alcohol, or use illegal drugs. Some items may interact with your medicine. What should I watch for while using this medication? Visit your care team for regular checks on your progress. You may want to keep a record at home of how you feel your condition is responding to treatment. You may want to share this information with your care team at each visit. You should contact your care team if your seizures get worse or if you have any new types of seizures. Do not stop taking this medication or any of your seizure medications unless instructed by your care team. Stopping your medication suddenly can increase your seizures or their severity. This medication may cause serious skin reactions. They can happen weeks to months after starting the medication. Contact your care team right away if you notice fevers or flu-like symptoms with a rash. The rash may be red or purple and then turn into blisters or peeling of the skin. Or, you might notice a red rash with swelling of the face, lips or lymph nodes in your neck or under your arms. Wear a medical identification bracelet or chain if you are taking this medication for seizures. Carry a card that lists all your medications. This medication may affect your coordination, reaction time, or judgment. Do not drive or operate machinery until you know how this medication affects you. Sit up  or stand slowly to reduce the risk of dizzy or fainting spells. Drinking alcohol with this medication can increase the risk of these side effects. Your mouth may get dry. Chewing sugarless gum or sucking hard candy, and drinking plenty of water may help. Watch for new or worsening thoughts of suicide or depression. This includes sudden changes in mood, behaviors, or thoughts. These changes can happen at any time but are more common in the beginning of treatment or after a change in dose. Call your care team right away if you experience these thoughts or worsening depression. If you become pregnant while using this medication, you may enroll in the [place] American Antiepileptic Drug Pregnancy Registry by calling [phone]. This registry collects information about the safety of antiepileptic medication use during pregnancy. What side effects may I notice from receiving this medication? Side effects that you should report to your care team as soon as possible: Allergic reactions or angioedema--skin rash, itching, hives, swelling of the face, eyes, lips, tongue, arms, or legs, trouble swallowing or breathing Rash, fever, and swollen lymph nodes Thoughts of suicide or self harm, worsening mood,  feelings of depression Trouble breathing Unusual changes in mood or behavior in children after use such as difficulty concentrating, hostility, or restlessness Side effects that usually do not require medical attention (report to your care team if they continue or are bothersome): Dizziness Drowsiness Nausea Swelling of ankles, feet, or hands Vomiting This list may not describe all possible side effects. Call your doctor for medical advice about side effects. You may report side effects to FDA at 1-800-FDA-1088. Where should I keep my medication? Keep out of reach of children and pets. Store at room temperature between 15 and 30 degrees C (59 and 86 degrees F). Get rid of any unused medication after the  expiration date. This medication may cause accidental overdose and death if taken by other adults, children, or pets. To get rid of medications that are no longer needed or have expired: Take the medication to a medication take-back program. Check with your pharmacy or law enforcement to find a location. If you cannot return the medication, check the label or package insert to see if the medication should be thrown out in the garbage or flushed down the toilet. If you are not sure, ask your care team. If it is safe to put it in the trash, empty the medication out of the container. Mix the medication with cat litter, dirt, coffee grounds, or other unwanted substance. Seal the mixture in a bag or container. Put it in the trash. NOTE: This sheet is a summary. It may not cover all possible information. If you have questions about this medicine, talk to your doctor, pharmacist, or health care provider.  2023 Elsevier/Gold Standard (2021-05-11 00:00:00)

## 2022-09-27 NOTE — Telephone Encounter (Signed)
[facility] order form completed. Selected their compound for general neuropathy. Form ready for Dr Cathren Laine signature.

## 2022-09-27 NOTE — Telephone Encounter (Signed)
Please order compounding cream for patient use upt to 3x a day on affected area for neuropathy pain

## 2022-09-27 NOTE — Progress Notes (Addendum)
[facility]    Provider:  Dr Lucia Gaskins Requesting Provider: Sharlene Dory* Primary Care Provider:  Sharlene Dory, DO  CC:  Burning in the hands and feet  Virtual Visit via Video Note  I connected with Julie Kennedy on 09/27/22 at  8:30 AM EST by a video enabled telemedicine application and verified that I am speaking with the correct person using two identifiers.  Location: Patient: home Provider: office   I discussed the limitations of evaluation and management by telemedicine and the availability of in person appointments. The patient expressed understanding and agreed to proceed.  Follow Up Instructions:    I discussed the assessment and treatment plan with the patient. The patient was provided an opportunity to ask questions and all were answered. The patient agreed with the plan and demonstrated an understanding of the instructions.   The patient was advised to call back or seek an in-person evaluation if the symptoms worsen or if the condition fails to improve as anticipated.  I provided 40 minutes of non-face-to-face time during this encounter.   Anson Fret, MD   09/27/2022: Patient last seen 2021. 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 We referred to Pain management referral. She declined LP but encouraged it to look for elevated protein. Symptoms are stable even improved. Taking B12 improved the symptoms. She is taking oral b12 and the injections were better. Will send b12 injections to home they helped a lot. She is not getting worse, so likely B12 is the right diagnosis.  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 She is  following for her low back and suggested surgery. That may be the cause of some of her symptoms. Her bacj is very sore, she went to PT, conservative measures did not help. No other focal neurologic deficits, associated symptoms, inciting events or modifiable factors. We discussed options, see A/P below.   04/05/2020: IMPRESSION: additional 10 minutes outsie appointment reviewing 1. L4-5 disc protrusion resulting in moderate spinal stenosis and left greater than right lateral recess stenosis. 2. L5-S1 disc protrusion with right lateral recess stenosis.  Patient complains of symptoms per HPI as well as the following symptoms: low back pain . Pertinent negatives and positives per HPI. All others negative  HPI 2021:  Julie Kennedy is a 35 y.o. female here as requested by Sharlene Dory* for neuropathic pain.  She has a past medical history of anxiety, depression, frequent headaches, transient neurologic symptoms, hard of hearing and hypertension.  Patient was already seen by neurology in April 2021 for dysesthesias at multiple sites, she reported her feet started going numb with tingling 2 months prior, over the last month "is ridiculous", her mouth reported swollen, her head and hands her legs and neck hurting, she reported needing help getting her tennis shoes on, like pins-and-needles and burning sensation 10 out of 10 and she was in a wheelchair, she uses crutches at home, she felt 6 months prior and has a 41-year-old, she got up to run and slipped on the living room rug.  MRI of the cervical and thoracic spine March 01, 2020 showed minimal disc bulging without significant stenosis with normal thoracic spine.  MRI of the brain 11/10/2018 normal.  Neurologic exam was reassuring, with reserved reflexes, 1+ symmetric in upper and lower extremity, EMG nerve conduction study was suggested but she declined,exam appeared normal.  Meds she tried include Pamelor that did not work, gabapentin, Lyrica,  prednisone, labs including sed rate, TSH, Sjogren's and EMG nerve conduction study was offered.  Patient is here alone with tingling and numbness burning in the hands and feet, hands are more numb than the feet, she reports she has "spinal damage", she fell back in January, she jumped off the couch she should often alarm and she fell on her bottom, she took Tylenol for a couple days and then in April she could not walk at all, she fell on her buttocks, progressively worsened and by April she had burning in her feet and hands, she was in a wheelchair, she couldn't walk at all due to weakness, she couldn't get in the shower, she went to a neurologist, she went to a spine specialist, she was hospitalist for this and then she saw neurology at [facility] and was unhappy with the doctor, her walking has improved since she saw a chiropractor but still has symptoms. She takes Lyrica, amitriptyline, she is improved and is taking B12. It may keep her up all night. She has burning on the bottom of her feet and her toes are throbbing, hands tingle, but symptoms were from head to toe prior to improvement. No other precipitating factors. No other focal neurologic deficits, associated symptoms, inciting events or modifiable factors.  Reviewed notes, labs and imaging from outside physicians, which showed:   Patient brings MRI on a CD 04/06/2020, I reviewed the images and agree with the report that shows L4-L5 disc protrusion resulting in moderate spinal stenosis and left greater than right lateral recess stenosis.  L5-S1 disc protrusion with right lateral recess stenosis.  Personally reviewed MRI brain images 04/06/2020 which was normal.  04/2019 b-12 315    I reviewed notes from [facility] back in March of this year, patient had Covid symptoms a few weeks prior to appointment on 01/21/2020, she developed bilateral foot pain ankle to toes on both feet, burning pain, hurts to walk and wear shoes, reports pain started a  week prior to appointment, having trouble sleeping due to the pain, both feet are very sensitive, feet cold, she denied low back pain redness or swelling in her feet, her Covid symptoms resolved and she denied bowel bladder incontinence.  She was found to have low magnesium, potassium and B12. Per Dr. Hollie Beach notes in June of this year, patient has been continuing dealing with pain issues, she had an MRI that prompted referral to neurosurgical team (moderate spinal stenosis of the lumbar spine), she apparently had an appointment with the spine specialist in June of this year, lots of anxiety, taking Lyrica for pain.  Also Elavil was added.  Per Dr. Hollie Beach notes in April of this year, patient reported burning in both her feet, Effexor did not help, negative autoimmune work-up, no weakness or balance issues otherwise.  She also reported numbness of feet, hands and lips.  Also tried nortriptyline.  And a prednisone burst.  Cymbalta was considered.  MRI of the lumbar spine showed moderate spinal stenosis and L4-L5 lateral recess stenosis, L5-S1 disc protrusion with right lateral recess stenosis.  MRI of the brain was negative.  I personally reviewed images and agree with the above.  These images were completed Apr 06, 2020.     Review of Systems: Patient complains of symptoms per  HPI as well as the following symptoms:numbness, weakness. Pertinent negatives and positives per HPI. All others negative.   Social History   Socioeconomic History   Marital status: Significant Other    Spouse name: Not on file   Number of children: 1   Years of education: Not on file   Highest education level: Not on file  Occupational History   Not on file  Tobacco Use   Smoking status: Never   Smokeless tobacco: Never  Vaping Use   Vaping Use: Never used  Substance and Sexual Activity   Alcohol use: Not Currently    Comment: was casually    Drug use: Never    Comment: pt denies a history, but cocaine listed  from previous visit    Sexual activity: Not Currently  Other Topics Concern   Not on file  Social History Narrative   Lives at home with boyfriend and daughter    Right handed   Caffeine: seldom    Social Determinants of Health   Financial Resource Strain: Not on file  Food Insecurity: Not on file  Transportation Needs: Not on file  Physical Activity: Not on file  Stress: Not on file  Social Connections: Not on file  Intimate Partner Violence: Not on file    Family History  Problem Relation Age of Onset   Arthritis Mother    Depression Mother    Hypertension Mother    Neuropathy Neg Hx     Past Medical History:  Diagnosis Date   Anxiety    Depression    Frequent headaches    Hard of hearing    Hypertension     Patient Active Problem List   Diagnosis Date Noted   Spinal stenosis of lumbar region with neurogenic claudication 09/27/2022   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 09/27/2022   Vitamin B12 deficiency neuropathy (HCC) 09/27/2022   Polyneuropathy associated with underlying disease (HCC) 08/11/2020   B12 deficiency 08/11/2020   Paresthesias 07/08/2020   Neuropathic pain 07/08/2020   Thrombocytopenia (HCC) 01/28/2020   Normocytic anemia 01/28/2020   TIA (transient ischemic attack) 10/28/2018   Essential hypertension 04/27/2018   GAD (generalized anxiety disorder) 04/27/2018    Past Surgical History:  Procedure Laterality Date   CESAREAN SECTION     CHOLECYSTECTOMY N/A 07/07/2018   Procedure: LAPAROSCOPIC CHOLECYSTECTOMY WITH INTRAOPERATIVE CHOLANGIOGRAM ERAS PATHWAY;  Surgeon: Chevis Pretty III, MD;  Location: [facility];  Service: General;  Laterality: N/A;   TONSILLECTOMY AND ADENOIDECTOMY      Current Outpatient Medications  Medication Sig Dispense Refill   gabapentin (NEURONTIN) 300 MG capsule Take 2-3 capsules (600-900 mg total) by mouth at bedtime. 90 capsule 4   ACETAMINOPHEN PO Take by mouth as needed.     B-D 3CC LUER-LOK  SYR 23GX1" 23G X 1" 3 ML MISC USE AS DIRECTED TO INJECT B12 INJECTIONS 1 each 0   cyanocobalamin (,VITAMIN B-12,) 1000 MCG/ML injection Inject 1 mL (1,000 mcg total) into the muscle once a week. Weekly for 4 weeks. Then monthly for 6 months. 4 mL 0   cyanocobalamin (VITAMIN B12) 1000 MCG/ML injection Inject 1 mL (1,000 mcg total) into the muscle every 30 (thirty) days. 1 mL 5   DULoxetine (CYMBALTA) 60 MG capsule Take 1 capsule (60 mg total) by mouth daily. 90 capsule 6   hydrOXYzine (ATARAX/VISTARIL) 10 MG tablet Take by mouth.     Multiple Vitamin (MULTI-VITAMIN) tablet Take by mouth.     OVER THE COUNTER MEDICATION  in the morning and at bedtime. Ultimate [employer] Vitamin     No current facility-administered medications for this visit.    Allergies as of 09/27/2022 - Review Complete 09/27/2022  Allergen Reaction Noted   Percocet [oxycodone-acetaminophen] Other (See Comments) 07/04/2018    Vitals: There were no vitals taken for this visit. Last Weight:  Wt Readings from Last 1 Encounters:  10/10/20 251 lb (113.9 kg)   Last Height:   Ht Readings from Last 1 Encounters:  10/10/20 5\' 8"  (1.727 m)    Physical exam: Exam: Gen: NAD, conversant      CV:  Denies palpitations or chest pain or SOB. VS: Breathing at a normal rate. Weight appears above normal. Not febrile. Eyes: Conjunctivae clear without exudates or hemorrhage  Neuro: Detailed Neurologic Exam  Speech:    Speech is normal; fluent and spontaneous with normal comprehension.  Cognition:    The patient is oriented to person, place, and time;     recent and remote memory intact;     language fluent;     normal attention, concentration,     fund of knowledge Cranial Nerves:    The pupils are equal, round, and reactive to light. Visual fields are full to finger confrontation. Extraocular movements are intact.  The face is symmetric with normal sensation. The palate elevates in the midline. Hearing intact. Voice is normal.  Shoulder shrug is normal. The tongue has normal motion without fasciculations.   Coordination:    Normal finger to nose  Gait:    Normal native gait  Motor Observation:   no involuntary movements noted. Tone:    Appears normal  Posture:    Posture is normal. normal erect    Strength:    Strength is anti-gravity and symmetric in the upper and lower limbs.      Sensation: intact to LT         Assessment/Plan:  Patient with acute onset paresthesias in the feet after falling on her buttocks with moderately severe lumbar spinal stenosis,, severe burning, paresthesias, on multiple nerve pain medications. Then developed symptoms in the hands. MRI of the brain and cervical spine unremarkable. MRI Lumbar spine with moderately severe stenosis. Exam shows trace reflexes (but she is guarding so difficult to assess) and Dysesthesia and allodynia of both hands and both feet.   - Patient last seen 2021. B12 194, oral not working. 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 She is taking oral b12 without improvement and the injections were better. Will send b12 injections to home they helped a lot. She is not getting worse, so likely B12 is the right diagnosis.  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   Moderate spinal stenosis lumbar spine: hat may be the cause of some of her symptoms. Her bacj is very sore, she went to PT, conservative measures did not help, she had epidural steroid injections. Repeat MRI lumbar spine, has neurogenic claudication, need to follow   Pain: Compounding crem, increase cymbalta, get start gabapentin at bedtime.    04/05/2020: IMPRESSION: 1. L4-5 disc protrusion resulting in moderate spinal stenosis and left greater than right lateral recess stenosis. 2. L5-S1 disc protrusion with right lateral recess stenosis.  Orders  Placed This Encounter  Procedures   MR LUMBAR SPINE WO CONTRAST   Vitamin B12   Methylmalonic acid, serum   Meds ordered this encounter  Medications  cyanocobalamin (VITAMIN B12) 1000 MCG/ML injection    Sig: Inject 1 mL (1,000 mcg total) into the muscle every 30 (thirty) days.    Dispense:  1 mL    Refill:  5    Please provide 3ml syring and a 23 g 1inch needle for every injection.   DULoxetine (CYMBALTA) 60 MG capsule    Sig: Take 1 capsule (60 mg total) by mouth daily.    Dispense:  90 capsule    Refill:  6   gabapentin (NEURONTIN) 300 MG capsule    Sig: Take 2-3 capsules (600-900 mg total) by mouth at bedtime.    Dispense:  90 capsule    Refill:  4    Cc: Wendling, Jilda RocheNicholas Paul*,  Wendling, Jilda RocheNicholas Paul, DO  Naomie DeanAntonia Okechukwu Regnier, MD  [facility] [address] [place], [place] [id number]  I spent over 90 minutes of face-to-face and non-face-to-face time with patient on the  1. B12 deficiency   2. 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 Spinal stenosis of lumbar region with neurogenic claudication   4. Neurogenic claudication due to lumbar spinal stenosis   5. Lumbar radiculopathy   6. Chronic midline low back pain, unspecified whether sciatica present   7. Vitamin B12 deficiency neuropathy (HCC)   8. Idiopathic peripheral neuropathy    diagnosis.  This included previsit chart review, lab review, study review, order entry, electronic health record documentation, patient education on the different diagnostic and therapeutic options, counseling and coordination of care, risks and benefits of management, compliance, or risk factor reduction  Phone [phone] Fax [phone]

## 2022-09-28 ENCOUNTER — Other Ambulatory Visit: Payer: Self-pay | Admitting: Neurology

## 2022-09-28 ENCOUNTER — Telehealth: Payer: Self-pay | Admitting: *Deleted

## 2022-09-28 ENCOUNTER — Encounter: Payer: Self-pay | Admitting: Neurology

## 2022-09-28 DIAGNOSIS — E538 Deficiency of other specified B group vitamins: Secondary | ICD-10-CM

## 2022-09-28 MED ORDER — CYANOCOBALAMIN 1000 MCG/ML IJ SOLN: 1000.0000 ug | INTRAMUSCULAR | 0 refills | Status: AC

## 2022-09-28 MED ORDER — CYANOCOBALAMIN 1000 MCG/ML IJ SOLN: 1000.0000 ug | INTRAMUSCULAR | 0 refills | Status: DC

## 2022-09-28 MED ORDER — CYANOCOBALAMIN 1000 MCG/ML IJ SOLN: 1000.0000 ug | INTRAMUSCULAR | 5 refills | Status: DC

## 2022-09-28 NOTE — Telephone Encounter (Signed)
Duloxetine PA, (Key: [id number]) If [facility] has not replied to your request within 24 hours, please reach out to the plan using the phone number located on the back on the [employer] card.

## 2022-09-28 NOTE — Telephone Encounter (Signed)
Per CMM: This drug is covered on the Preferred Drug List. It does not require prior approval. You filled this medication on 09/27/2022 for a quantity of 90 for a 90 day supply at [facility]. Your next refill is on 12/04/2022.Marland Kitchen

## 2022-09-28 NOTE — Telephone Encounter (Signed)
Received fax from [facility]. Duloxetine DR capsule is actually on preferred drug list and does not require PA. Pt eligible for refill again on 12/04/22. She last filled 90 day supply on 09/27/22.   I faxed this letter to pt's pharmacy. Received a receipt of confirmation.

## 2022-09-28 NOTE — Telephone Encounter (Signed)
Melvenia Beam, MD  Gna[facility] 4 Calls27 minutes ago (4:10 PM)   Yes give her 4 weeks of injections and then monthly after that for a year thanks

## 2022-10-21 ENCOUNTER — Telehealth: Payer: Self-pay | Admitting: Neurology

## 2022-10-21 DIAGNOSIS — M545 Low back pain, unspecified: Secondary | ICD-10-CM

## 2022-10-21 DIAGNOSIS — M48062 Spinal stenosis, lumbar region with neurogenic claudication: Secondary | ICD-10-CM

## 2022-10-21 DIAGNOSIS — M5416 Radiculopathy, lumbar region: Secondary | ICD-10-CM

## 2022-10-21 NOTE — Telephone Encounter (Signed)
I LMVM for pt regarding needing 6wks PT within the last 6 months to get MRI approved.  If willing we can refer to PT and see how she does and then if still thinks she needs it then to let us know.  Pt to respond back via phone or mychart.

## 2022-10-21 NOTE — Addendum Note (Signed)
Addended by: Bertram Savin on: 10/21/2022 05:40 PM   Modules accepted: Orders

## 2022-10-21 NOTE — Telephone Encounter (Signed)
Please let patient know she has to have 6 weeks of PT within the last 6 months to get the MRI approved. If she is willing we can refer her to PT and when she has completed this and still feels she would like an MRI she can let us know and we can reorder thanks

## 2022-10-21 NOTE — Telephone Encounter (Signed)
MRI not getting approved because she needs to have done 6 weeks of PT in the last 6 months. If you would like to do a peer to peer the phone  number is [phone] case #[id number]

## 2022-10-27 ENCOUNTER — Ambulatory Visit: Payer: Medicaid Other | Admitting: Rehabilitative and Restorative Service Providers"

## 2022-10-28 NOTE — Therapy (Incomplete)
OUTPATIENT PHYSICAL THERAPY THORACOLUMBAR EVALUATION   Patient Name: Julie Kennedy MRN: [id number] DOB:09/18/1986, 36 y.o., female Today's Date: 10/28/2022  END OF SESSION:   Past Medical History:  Diagnosis Date   Anxiety    Depression    Frequent headaches    Hard of hearing    Hypertension    Past Surgical History:  Procedure Laterality Date   CESAREAN SECTION     CHOLECYSTECTOMY N/A 07/07/2018   Procedure: LAPAROSCOPIC CHOLECYSTECTOMY WITH INTRAOPERATIVE CHOLANGIOGRAM ERAS PATHWAY;  Surgeon: Griselda Miner, MD;  Location: [facility];  Service: General;  Laterality: N/A;   TONSILLECTOMY AND ADENOIDECTOMY     Patient Active Problem List   Diagnosis Date Noted   Spinal stenosis of lumbar region with neurogenic claudication 09/27/2022   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 09/27/2022   Vitamin B12 deficiency neuropathy (HCC) 09/27/2022   Polyneuropathy associated with underlying disease (HCC) 08/11/2020   B12 deficiency 08/11/2020   Paresthesias 07/08/2020   Neuropathic pain 07/08/2020   Thrombocytopenia (HCC) 01/28/2020   Normocytic anemia 01/28/2020   TIA (transient ischemic attack) 10/28/2018   Essential hypertension 04/27/2018   GAD (generalized anxiety disorder) 04/27/2018    PCP: Sharlene Dory, DO   REFERRING PROVIDER: Anson Fret, MD   REFERRING DIAG:  [phone] (ICD-10-CM) - Spinal stenosis of lumbar region with neurogenic claudication  M48.062 (ICD-10-CM) - Neurogenic claudication due to lumbar spinal stenosis  M54.16 (ICD-10-CM) - Lumbar radiculopathy  M54.50,G89.29 (ICD-10-CM) - Chronic midline low back pain, unspecified whether sciatica present    Rationale for Evaluation and Treatment: Rehabilitation  THERAPY DIAG:  No diagnosis found.  ONSET DATE: ***  SUBJECTIVE:                                                                                                                                                                                            SUBJECTIVE STATEMENT: *** Pt reports limitations in sitting/standing/walking of ***/***/***.  PERTINENT HISTORY:  HTN, TIA, polyneuropathy, spinal stenosis  PAIN:  Are you having pain? Yes: NPRS scale: ***/10 Pain location: *** Pain description: *** Aggravating factors: *** Relieving factors: ***  PRECAUTIONS: {Therapy precautions:24002}  WEIGHT BEARING RESTRICTIONS: No  FALLS:  Has patient fallen in last 6 months? {fallsyesno:27318}  LIVING ENVIRONMENT: Lives with: {[facility] lives with:25569::"lives with their family"} Lives in: {Lives in:25570} Stairs: {opstairs:27293} Has following equipment at home: {Assistive devices:23999}  OCCUPATION: ***  PLOF: {PLOF:24004}  PATIENT GOALS: ***  NEXT MD VISIT:   OBJECTIVE:   DIAGNOSTIC FINDINGS:  MRI 2021 L4-5: Disc desiccation and mild disc space narrowing. A broad posterior disc protrusion greatest in the left  paracentral region, congenitally short pedicles, and prominent dorsal epidural fat result in moderate spinal stenosis and left greater than right lateral recess stenosis with potential L5 nerve root impingement, particularly on the left. Patent neural foramina.   L5-S1: Disc desiccation. A small T2 hyperintense right paracentral to subarticular disc protrusion results in moderate right lateral recess stenosis and potential right L5 nerve root impingement. No spinal or neural foraminal stenosis.   IMPRESSION: 1. L4-5 disc protrusion resulting in moderate spinal stenosis and left greater than right lateral recess stenosis. 2. L5-S1 disc protrusion with right lateral recess stenosis.  PATIENT SURVEYS:  FOTO ***  SCREENING FOR RED FLAGS: Bowel or bladder incontinence: {Yes/No:304960894} Spinal tumors: {Yes/No:304960894} Cauda equina syndrome: {Yes/No:304960894} Compression fracture: {Yes/No:304960894} Abdominal aneurysm: {Yes/No:304960894}  COGNITION: Overall cognitive  status: {cognition:24006}     SENSATION: {sensation:27233}  MUSCLE LENGTH: HS: Quads: ITB: Piriformis: Hip Flexors: Heelcords:   POSTURE: {posture:25561}  PALPATION: Palpation: TTP at ***. Increased tissue tension in *** Spinal Mobility: Patellar Mobility:     LUMBAR ROM:   AROM eval  Flexion   Extension   Right lateral flexion   Left lateral flexion   Right rotation   Left rotation    (Blank rows = not tested)  LOWER EXTREMITY ROM:     {AROM/PROM:27142}  Right eval Left eval  Hip flexion    Hip extension    Hip abduction    Hip adduction    Hip internal rotation    Hip external rotation    Knee flexion    Knee extension    Ankle dorsiflexion    Ankle plantarflexion    Ankle inversion    Ankle eversion     (Blank rows = not tested)  LOWER EXTREMITY MMT:    MMT Right eval Left eval  Hip flexion    Hip extension    Hip abduction    Hip adduction    Hip internal rotation    Hip external rotation    Knee flexion    Knee extension    Ankle dorsiflexion    Ankle plantarflexion    Ankle inversion    Ankle eversion     (Blank rows = not tested)  LUMBAR SPECIAL TESTS:  {lumbar special test:25242}  FUNCTIONAL TESTS:  {Functional tests:24029}  GAIT: Distance walked: *** Assistive device utilized: {Assistive devices:23999} Level of assistance: {Levels of assistance:24026} Comments: ***  TODAY'S TREATMENT:                                                                                                                              DATE: ***  11/01/22 See Pt ed   PATIENT EDUCATION:  Education details: PT eval findings, anticipated POC, initial HEP, and *** Perso educated: Patient Education method: Explanation, Demonstration, and Handouts Education comprehension: verbalized understanding and returned demonstration  HOME EXERCISE PROGRAM: ***  ASSESSMENT:  CLINICAL IMPRESSION: Patient is a 36 y.o. female who was seen today for  physical therapy evaluation  and treatment for ***.   OBJECTIVE IMPAIRMENTS: {opptimpairments:25111}.   ACTIVITY LIMITATIONS: {activitylimitations:27494}  PARTICIPATION LIMITATIONS: {participationrestrictions:25113}  PERSONAL FACTORS: {Personal factors:25162} are also affecting patient's functional outcome.   REHAB POTENTIAL: {rehabpotential:25112}  CLINICAL DECISION MAKING: {clinical decision making:25114}  EVALUATION COMPLEXITY: {Evaluation complexity:25115}   GOALS: Goals reviewed with patient? {yes/no:20286}  SHORT TERM GOALS: Target date: {follow up:25551} (Remove Blue Hyperlink)  Patient will be independent with initial HEP.  Baseline: *** Goal status: {GOALSTATUS:25110}  2.  Patient will report centralization of radicular symptoms.  Baseline: *** Goal status: {GOALSTATUS:25110}  3.  *** Baseline: *** Goal status: {GOALSTATUS:25110}   LONG TERM GOALS: Target date: {follow up:25551}  (Remove Blue Hyperlink)  Patient will be independent with advanced/ongoing HEP to improve outcomes and carryover.  Baseline: *** Goal status: {GOALSTATUS:25110}  2.  Patient will report 75% improvement in low back pain to improve QOL.  Baseline: *** Goal status: {GOALSTATUS:25110}  3.  Patient will demonstrate full/functional pain free lumbar ROM to perform ADLs.   Baseline: *** Goal status: {GOALSTATUS:25110}  4.  Patient will demonstrate improved strength to ***/5 to normalize ***. Baseline: *** Goal status: {GOALSTATUS:25110}  5.  Patient will report *** on lumbar FOTO to demonstrate improved functional ability.  Baseline: *** Goal status: {GOALSTATUS:25110}   6.  Patient will tolerate *** min of (standing/sitting/walking) to perform ***. Baseline: *** Goal status: {GOALSTATUS:25110}  7.  Patient to demonstrate ability to achieve and maintain good spinal alignment/posturing and body mechanics needed for daily activities. Baseline: *** Goal status:  {GOALSTATUS:25110}  8. Patient will *** Baseline: *** Goal status: {GOALSTATUS:25110}   PLAN:  PT FREQUENCY: {rehab frequency:25116}  PT DURATION: {rehab duration:25117}  PLANNED INTERVENTIONS: {rehab planned interventions:25118::"Therapeutic exercises","Therapeutic activity","Neuromuscular re-education","Balance training","Gait training","Patient/Family education","Self Care","Joint mobilization"}.  PLAN FOR NEXT SESSION: ***   Betina Puckett, PT 10/28/2022, 2:21 PM

## 2022-11-01 ENCOUNTER — Ambulatory Visit: Payer: Medicaid Other | Admitting: Physical Therapy

## 2022-11-01 ENCOUNTER — Encounter: Payer: Self-pay | Admitting: Physical Therapy

## 2022-12-03 ENCOUNTER — Encounter: Payer: Self-pay | Admitting: Neurology

## 2023-02-15 NOTE — Therapy (Unsigned)
OUTPATIENT PHYSICAL THERAPY THORACOLUMBAR EVALUATION  Patient Name: Julie Kennedy MRN: [id number] DOB:January 24, 1986, 37 y.o., female Today's Date: 02/16/2023  END OF SESSION:  PT End of Session - 02/16/23 1157     Visit Number 1    Number of Visits 16    Date for PT Re-Evaluation 04/17/23    Authorization Type [place] Medicaid [employer]/ auth submitted after eval *check notes    PT Start Time 1147    PT Stop Time 1230    PT Time Calculation (min) 43 min    Activity Tolerance Patient limited by pain    Behavior During Therapy WFL for tasks assessed/performed            Past Medical History:  Diagnosis Date   Anxiety    Depression    Frequent headaches    Hard of hearing    Hypertension    Past Surgical History:  Procedure Laterality Date   CESAREAN SECTION     CHOLECYSTECTOMY N/A 07/07/2018   Procedure: LAPAROSCOPIC CHOLECYSTECTOMY WITH INTRAOPERATIVE CHOLANGIOGRAM ERAS PATHWAY;  Surgeon: Jovita Kussmaul, MD;  Location: [facility];  Service: General;  Laterality: N/A;   TONSILLECTOMY AND ADENOIDECTOMY     Patient Active Problem List   Diagnosis Date Noted   Spinal stenosis of lumbar region with neurogenic claudication 09/27/2022   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 09/27/2022   Vitamin B12 deficiency neuropathy ([place]) 09/27/2022   Polyneuropathy associated with underlying disease ([place]) 08/11/2020   B12 deficiency 08/11/2020   Paresthesias 07/08/2020   Neuropathic pain 07/08/2020   Thrombocytopenia ([place]) 01/28/2020   Normocytic anemia 01/28/2020   TIA (transient ischemic attack) 10/28/2018   Essential hypertension 04/27/2018   GAD (generalized anxiety disorder) 04/27/2018    PCP: Candyce Churn DO REFERRING PROVIDER: Sarina Ill MD REFERRING DIAG: spinal stenosis of the lumbar region Rationale for Evaluation and Treatment: Rehabilitation THERAPY DIAG:  Muscle weakness (generalized)  Other low back pain  Other abnormalities of gait and  mobility  Other symptoms and signs involving the nervous system  ONSET DATE: 12/2019 SUBJECTIVE:                                                                                                                                                                                          SUBJECTIVE STATEMENT: The patient reports sudden onset of pain in 2021 and she saw multiple neurologists with no diagnosis. She notes she was in a w/c due to inability to walk. She was diagnosed with B12 deficiency and has improved to walking, but pain is an ongoing problem and her legs feel heavy all of the time. Her pain worsens  at night. In addition to this condition, she has gained 60 lbs (meds).  Her goal is to get to the place where she won't need meds to manage pain.  PERTINENT HISTORY:  HTN, thrombocytopenia, anxiety, depression, hard of hearing, B12 deficiency PAIN:  Are you having pain? Yes: NPRS scale: 7/10 Pain location: low back, legs Pain description: feet "on fire",  Aggravating factors: walking barefoot, night time, cold weather Relieving factors: gabapentin PRECAUTIONS: None WEIGHT BEARING RESTRICTIONS: No FALLS:  Has patient fallen in last 6 months?  No. LIVING ENVIRONMENT: Lives with: lives with their partner and lives with their daughter (31 yo) Lives in: House/apartment Stairs: Yes: Internal: 12-14 steps; one rail, doesn't have to go up/down-- bedroom on main level OCCUPATION:has been out of work due to pain since 2021 PLOF: Independent PATIENT GOALS: "be more active", get back in the gym, be able to plan her wedding, get some relief from pain  OBJECTIVE:   DIAGNOSTIC FINDINGS:  May 2021 MRI of the lumbar spine showed moderate spinal stenosis and L4-L5 lateral recess stenosis, L5-S1 disc protrusion with right lateral recess stenosis. MRI of the brain was negative.   COGNITION: Overall cognitive status: Within functional limits for tasks assessed     SENSATION: Nerve pain in bilateral  LEs  POSTURE: rounded shoulders,   PALPATION: Hypomobility lumbar spine; tender to deep palpation, tender SI region as well  LUMBAR ROM:   AROM eval  Flexion 50% limited With pain  Extension 50% limited No increase in pain  Right lateral flexion 25% limited No pain  Left lateral flexion 25% limited No pain  Right rotation 25% limited no pain  Left rotation 25% limited No pain   (Blank rows = not tested)  LOWER EXTREMITY ROM:    tightness noted bilat HS and hip flexors  LOWER EXTREMITY MMT:    MMT Right eval Left eval comments  Hip flexion 3/5 3+/5 pain  Hip extension     Hip abduction     Hip adduction     Hip internal rotation     Hip external rotation     Knee flexion 5/5 5/5   Knee extension 5/5 5/5   Ankle dorsiflexion 4/5 4/5   Ankle plantarflexion     Ankle inversion     Ankle eversion      (Blank rows = not tested)  FUNCTIONAL TESTS:  Negotiates steps step to pattern  GAIT: Distance walked: slowed pace x 100 ft mod indep Comments: slow, antalgic appearing gait pattern  [facility] Adult PT Treatment:                                                DATE: 02/16/23 Therapeutic Exercise: See HEP below HS stretch seated  PATIENT EDUCATION:  Education details: HEP Person educated: Patient Education method: Explanation, Demonstration, and Handouts Education comprehension: verbalized understanding, returned demonstration, and needs further education  HOME EXERCISE PROGRAM: Access Code: [email or web address] URL: [email or web address] Date: 02/16/2023 Prepared by: Rudell Cobb  Exercises - Prone on Elbows Stretch  - 1-2 x daily - 7 x weekly - 1 sets - 1 reps - 2 minutes hold - Sidelying Thoracic Rotation with Open Book  - 1-2 x daily - 7 x weekly - 1 sets - 10 reps  ASSESSMENT:  CLINICAL IMPRESSION: Patient is a 37 y.o. female who was seen today for physical therapy evaluation  and treatment for low back pain, LE pain, and weakness. She has  multi-year h/o weakness and pain that she notes is from vitamin B 12 deficiency. She has impairments today in LE strength, flexibility, pain limiting daily activities, dec'd ADL performance.   OBJECTIVE IMPAIRMENTS: Abnormal gait, decreased activity tolerance, decreased balance, decreased endurance, difficulty walking, decreased strength, hypomobility, increased fascial restrictions, impaired flexibility, and pain.   ACTIVITY LIMITATIONS: carrying, bending, standing, squatting, and locomotion level  PARTICIPATION LIMITATIONS: community activity and occupation  PERSONAL FACTORS: Time since onset of injury/illness/exacerbation and 3+ comorbidities: HTN, anxiety, depression, B12 deficiency  are also affecting patient's functional outcome.   REHAB POTENTIAL: Good  CLINICAL DECISION MAKING: Stable/uncomplicated  EVALUATION COMPLEXITY: Low   GOALS: Goals reviewed with patient? Yes  SHORT TERM GOALS: Target date: 03/16/23  The patient will be indep with HEP. Baseline: initiated at eval Goal status: INITIAL  2.  The patient will report pain 5/10 at worst.  Baseline: 7/10 Goal status: INITIAL  3.  The patient will perform lumbar flexion to shins without increase in low back pain.  Baseline:  increased pain today Goal status: INITIAL  LONG TERM GOALS: Target date: 04/13/23  The patient will be indep with HEP progression  Baseline:  initiated at eval Goal status: INITIAL  2.  The patient will improve hip flexor strength to 4/5 bilaterally. Baseline:  3/5 R and 3+/5 L Goal status: INITIAL  3.  The patient will tolerate prone press ups without increased lumbar pain. Baseline:  modified to lay prone on elbows/ significant pain at eval Goal status: INITIAL  4.  The patient will tolerate laying supine with knees bent. Baseline: Significant pain today-- had to modify during evaluation. Goal status: INITIAL  5.  The patient will tolerate home walking program x 10 minutes  nonstop. Baseline:  Unable to participate in regular program due to pain Goal status: INITIAL  PLAN:  PT FREQUENCY: 2x/week  PT DURATION: 8 weeks  PLANNED INTERVENTIONS: Therapeutic exercises, Therapeutic activity, Neuromuscular re-education, Balance training, Gait training, Patient/Family education, Self Care, Joint mobilization, Aquatic Therapy, Dry Needling, Moist heat, Taping, Traction, and Manual therapy.  PLAN FOR NEXT SESSION: Progress HEP to tolerance, walking program, LE strengthening, gait training, and core stability.   [place], PT 02/16/2023, 12:37 PM

## 2023-02-16 ENCOUNTER — Ambulatory Visit: Payer: Medicaid Other | Attending: Neurology | Admitting: Rehabilitative and Restorative Service Providers"

## 2023-02-16 ENCOUNTER — Encounter: Payer: Self-pay | Admitting: Rehabilitative and Restorative Service Providers"

## 2023-02-16 ENCOUNTER — Other Ambulatory Visit: Payer: Self-pay

## 2023-02-16 DIAGNOSIS — M5459 Other low back pain: Secondary | ICD-10-CM

## 2023-02-16 DIAGNOSIS — M48062 Spinal stenosis, lumbar region with neurogenic claudication: Secondary | ICD-10-CM | POA: Diagnosis not present

## 2023-02-16 DIAGNOSIS — R29818 Other symptoms and signs involving the nervous system: Secondary | ICD-10-CM

## 2023-02-16 DIAGNOSIS — R2689 Other abnormalities of gait and mobility: Secondary | ICD-10-CM

## 2023-02-16 DIAGNOSIS — M545 Low back pain, unspecified: Secondary | ICD-10-CM | POA: Insufficient documentation

## 2023-02-16 DIAGNOSIS — M5416 Radiculopathy, lumbar region: Secondary | ICD-10-CM | POA: Insufficient documentation

## 2023-02-16 DIAGNOSIS — M6281 Muscle weakness (generalized): Secondary | ICD-10-CM | POA: Diagnosis not present

## 2023-02-16 DIAGNOSIS — G8929 Other chronic pain: Secondary | ICD-10-CM | POA: Insufficient documentation

## 2023-02-25 ENCOUNTER — Ambulatory Visit: Payer: Medicaid Other | Admitting: Rehabilitative and Restorative Service Providers"

## 2023-03-04 ENCOUNTER — Encounter: Payer: Self-pay | Admitting: Rehabilitative and Restorative Service Providers"

## 2023-03-04 ENCOUNTER — Ambulatory Visit: Payer: Medicaid Other | Attending: Neurology | Admitting: Rehabilitative and Restorative Service Providers"

## 2023-03-04 DIAGNOSIS — M6281 Muscle weakness (generalized): Secondary | ICD-10-CM | POA: Insufficient documentation

## 2023-03-04 DIAGNOSIS — R2689 Other abnormalities of gait and mobility: Secondary | ICD-10-CM | POA: Diagnosis present

## 2023-03-04 DIAGNOSIS — M5459 Other low back pain: Secondary | ICD-10-CM | POA: Insufficient documentation

## 2023-03-04 DIAGNOSIS — R29818 Other symptoms and signs involving the nervous system: Secondary | ICD-10-CM | POA: Insufficient documentation

## 2023-03-04 NOTE — Therapy (Signed)
OUTPATIENT PHYSICAL THERAPY THORACOLUMBAR EVALUATION  Patient Name: Julie Kennedy MRN: [id number] DOB:01-23-1986, 37 y.o., female Today's Date: 03/04/2023  END OF SESSION:  PT End of Session - 03/04/23 1101     Visit Number 2    Number of Visits 16    Date for PT Re-Evaluation 04/17/23    Authorization Type [place] Medicaid wellcare/ auth submitted after eval *check notes    PT Start Time 1101    PT Stop Time 1140    PT Time Calculation (min) 39 min    Activity Tolerance Patient limited by pain    Behavior During Therapy WFL for tasks assessed/performed            Past Medical History:  Diagnosis Date   Anxiety    Depression    Frequent headaches    Hard of hearing    Hypertension    Past Surgical History:  Procedure Laterality Date   CESAREAN SECTION     CHOLECYSTECTOMY N/A 07/07/2018   Procedure: LAPAROSCOPIC CHOLECYSTECTOMY WITH INTRAOPERATIVE CHOLANGIOGRAM ERAS PATHWAY;  Surgeon: Griselda Miner, MD;  Location: [facility];  Service: General;  Laterality: N/A;   TONSILLECTOMY AND ADENOIDECTOMY     Patient Active Problem List   Diagnosis Date Noted   Spinal stenosis of lumbar region with neurogenic claudication 09/27/2022   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 09/27/2022   Vitamin B12 deficiency neuropathy 09/27/2022   Polyneuropathy associated with underlying disease 08/11/2020   B12 deficiency 08/11/2020   Paresthesias 07/08/2020   Neuropathic pain 07/08/2020   Thrombocytopenia 01/28/2020   Normocytic anemia 01/28/2020   TIA (transient ischemic attack) 10/28/2018   Essential hypertension 04/27/2018   GAD (generalized anxiety disorder) 04/27/2018    PCP: Ashley Mariner DO REFERRING PROVIDER: Naomie Dean MD REFERRING DIAG: spinal stenosis of the lumbar region Rationale for Evaluation and Treatment: Rehabilitation THERAPY DIAG:  Muscle weakness (generalized)  Other low back pain  Other abnormalities of gait and mobility  Other  symptoms and signs involving the nervous system  ONSET DATE: 12/2019 SUBJECTIVE:                                                                                                                                                                                          SUBJECTIVE STATEMENT: The patient is doing HEP daily. She notes she is getting over the flu and had to cx last week. She feels pain on the R side of low back today  EVAL: The patient reports sudden onset of pain in 2021 and she saw multiple neurologists with no diagnosis. She notes she was in a w/c due to inability to  walk. She was diagnosed with B12 deficiency and has improved to walking, but pain is an ongoing problem and her legs feel heavy all of the time. Her pain worsens at night. In addition to this condition, she has gained 60 lbs (meds).  Her goal is to get to the place where she won't need meds to manage pain.  PERTINENT HISTORY:  HTN, thrombocytopenia, anxiety, depression, hard of hearing, B12 deficiency PAIN:  Are you having pain? Yes: NPRS scale: 5/10 Pain location: low back, legs Pain description: feet "on fire",  Aggravating factors: walking barefoot, night time, cold weather Relieving factors: gabapentin PRECAUTIONS: None WEIGHT BEARING RESTRICTIONS: No FALLS:  Has patient fallen in last 6 months?  No. LIVING ENVIRONMENT: Lives with: lives with their partner and lives with their daughter (57 yo) Lives in: House/apartment Stairs: Yes: Internal: 12-14 steps; one rail, doesn't have to go up/down-- bedroom on main level OCCUPATION:has been out of work due to pain since 2021 PLOF: Independent PATIENT GOALS: "be more active", get back in the gym, be able to plan her wedding, get some relief from pain  OBJECTIVE:  (Measures in this section from initial evaluation unless otherwise noted) DIAGNOSTIC FINDINGS:  May 2021 MRI of the lumbar spine showed moderate spinal stenosis and L4-L5 lateral recess stenosis, L5-S1 disc  protrusion with right lateral recess stenosis. MRI of the brain was negative.  COGNITION: Overall cognitive status: Within functional limits for tasks assessed   SENSATION: Nerve pain in bilateral LEs POSTURE: rounded shoulders,  PALPATION: Hypomobility lumbar spine; tender to deep palpation, tender SI region as well LUMBAR ROM:  AROM eval  Flexion 50% limited With pain  Extension 50% limited No increase in pain  Right lateral flexion 25% limited No pain  Left lateral flexion 25% limited No pain  Right rotation 25% limited no pain  Left rotation 25% limited No pain   (Blank rows = not tested) LOWER EXTREMITY ROM:    tightness noted bilat HS and hip flexors LOWER EXTREMITY MMT:   MMT Right eval Left eval comments  Hip flexion 3/5 3+/5 pain  Hip extension     Hip abduction     Hip adduction     Hip internal rotation     Hip external rotation     Knee flexion 5/5 5/5   Knee extension 5/5 5/5   Ankle dorsiflexion 4/5 4/5   Ankle plantarflexion     Ankle inversion     Ankle eversion      (Blank rows = not tested) FUNCTIONAL TESTS:  Negotiates steps step to pattern GAIT: Distance walked: slowed pace x 100 ft mod indep Comments: slow, antalgic appearing gait pattern  [facility] Adult PT Treatment:                                                DATE: 03/04/23 Therapeutic Exercise: Supine TA activation during marching x 5 reps Holding 3 # overhead and marching x 10 reps alternating Lumbar rocking R and L x 10 reps Hamstring stretch 30 seconds R and L Hip adductor stretch 30 seconds R and L  Prone On elbows x 1 minute Press up x 5 reps Quadriped Cat/cow x 10 with cues on positioning Rocking laterally and anterior/posterior Attempted rotation into thread the needle, unable to tolerate Standing Mini squats x 10 reps with some pain in legs Heel raises  x 10 reps Sidelying Thoracic opening modified with knee positioning-- see updated HEP Seated Ankle DF green band x 15  reps Neuromuscular re-ed: Standing with eyes closed and feet wide Standing with feet narrow with horizontal and vertical head turns x 10 reps Gaze x 1 viewing x 15 reps horiz and vertical seated, progressed to standing with knees blocking against mat for proprioceptive feedback Gait: Gait on solid surfaces with some report of imbalance PATIENT EDUCATION:  Education details: HEP Person educated: Patient Education method: Programmer, multimedia, Demonstration, and Handouts Education comprehension: verbalized understanding, returned demonstration, and needs further education  HOME EXERCISE PROGRAM: Access Code: [email or web address] URL: [email or web address] Date: 03/04/2023 Prepared by: Margretta Ditty  Exercises - Sidelying Open Book Thoracic Lumbar Rotation and Extension  - 1-2 x daily - 7 x weekly - 1 sets - 10 reps - Prone Press Up  - 1-2 x daily - 7 x weekly - 1 sets - 10 reps - Cat Cow  - 1-2 x daily - 7 x weekly - 1 sets - 10 reps - Supine March  - 1-2 x daily - 7 x weekly - 1 sets - 10 reps  ASSESSMENT:  CLINICAL IMPRESSION: The patient was fatigued with activities today. We discussed regular walking program as tolerated. PT to continue to progress activities to patient tolerance.   EVAL: Patient is a 37 y.o. female who was seen today for physical therapy evaluation and treatment for low back pain, LE pain, and weakness. She has multi-year h/o weakness and pain that she notes is from vitamin B 12 deficiency. She has impairments today in LE strength, flexibility, pain limiting daily activities, dec'd ADL performance.   GOALS: Goals reviewed with patient? Yes  SHORT TERM GOALS: Target date: 03/16/23  The patient will be indep with HEP. Baseline: initiated at eval Goal status: INITIAL  2.  The patient will report pain 5/10 at worst.  Baseline: 7/10 Goal status: INITIAL  3.  The patient will perform lumbar flexion to shins without increase in low back pain.  Baseline:   increased pain today Goal status: INITIAL  LONG TERM GOALS: Target date: 04/13/23  The patient will be indep with HEP progression  Baseline:  initiated at eval Goal status: INITIAL  2.  The patient will improve hip flexor strength to 4/5 bilaterally. Baseline:  3/5 R and 3+/5 L Goal status: INITIAL  3.  The patient will tolerate prone press ups without increased lumbar pain. Baseline:  modified to lay prone on elbows/ significant pain at eval Goal status: INITIAL  4.  The patient will tolerate laying supine with knees bent. Baseline: Significant pain today-- had to modify during evaluation. Goal status: INITIAL  5.  The patient will tolerate home walking program x 10 minutes nonstop. Baseline:  Unable to participate in regular program due to pain Goal status: INITIAL  PLAN:  PT FREQUENCY: 2x/week  PT DURATION: 8 weeks  PLANNED INTERVENTIONS: Therapeutic exercises, Therapeutic activity, Neuromuscular re-education, Balance training, Gait training, Patient/Family education, Self Care, Joint mobilization, Aquatic Therapy, Dry Needling, Moist heat, Taping, Traction, and Manual therapy.  PLAN FOR NEXT SESSION: Progress HEP to tolerance, walking program, LE strengthening, gait training, and core stability. Progress walking and community activities.   Zavon Hyson, PT 03/04/2023, 12:19 PM

## 2023-03-11 ENCOUNTER — Encounter: Payer: Self-pay | Admitting: Rehabilitative and Restorative Service Providers"

## 2023-03-11 ENCOUNTER — Ambulatory Visit: Payer: Medicaid Other | Admitting: Rehabilitative and Restorative Service Providers"

## 2023-03-23 ENCOUNTER — Encounter: Payer: Self-pay | Admitting: Neurology

## 2023-03-25 ENCOUNTER — Encounter: Payer: Medicaid Other | Admitting: Rehabilitative and Restorative Service Providers"

## 2023-03-28 ENCOUNTER — Other Ambulatory Visit: Payer: Self-pay | Admitting: *Deleted

## 2023-03-28 DIAGNOSIS — E538 Deficiency of other specified B group vitamins: Secondary | ICD-10-CM

## 2023-03-28 MED ORDER — CYANOCOBALAMIN 1000 MCG/ML IJ SOLN: 1000.0000 ug | INTRAMUSCULAR | 5 refills | Status: AC

## 2023-05-17 ENCOUNTER — Telehealth: Payer: Self-pay | Admitting: Neurology

## 2023-05-17 MED ORDER — GABAPENTIN 300 MG PO CAPS: 600.0000 mg | ORAL_CAPSULE | Freq: Every day | ORAL | 4 refills | Status: AC

## 2023-05-17 NOTE — Telephone Encounter (Signed)
   I called [phone] and spoke with staff and confirmed refills are needed. Refills sent to pharmacy.

## 2023-05-17 NOTE — Telephone Encounter (Signed)
Pt would like a refill for gabapentin (NEURONTIN) 300 MG capsule medication

## 2023-05-17 NOTE — Addendum Note (Signed)
Addended by: Bertram Savin on: 05/17/2023 11:31 AM   Modules accepted: Orders

## 2023-06-23 ENCOUNTER — Telehealth: Payer: Self-pay | Admitting: Neurology

## 2023-06-23 ENCOUNTER — Encounter: Payer: Self-pay | Admitting: Neurology

## 2023-06-23 DIAGNOSIS — M5416 Radiculopathy, lumbar region: Secondary | ICD-10-CM

## 2023-06-23 DIAGNOSIS — M48062 Spinal stenosis, lumbar region with neurogenic claudication: Secondary | ICD-10-CM

## 2023-06-23 DIAGNOSIS — M545 Low back pain, unspecified: Secondary | ICD-10-CM

## 2023-06-23 NOTE — Telephone Encounter (Signed)
Referral for physical therapy sent through EPIC to [facility]. Phone: [phone].

## 2023-06-27 NOTE — Telephone Encounter (Signed)
Order form signed by Dr Lucia Gaskins, then faxed to [facility]. Received a receipt of confirmation.

## 2023-06-27 NOTE — Telephone Encounter (Signed)
Completed order form for [facility] (same compound as before). Form is awaiting Dr Trevor Mace review and signature.  Compound: amantadine 8%, baclofen 2%, gabapentin 8%, amitriptyline 4%, bupivacaine 2%, clonidine 0.2%, and nifedipine 2%.    Apply 1-2 grams to the affected area 3-4 times daily. Dispense 240 grams with 5 refills.

## 2023-07-08 ENCOUNTER — Other Ambulatory Visit: Payer: Self-pay

## 2023-07-08 ENCOUNTER — Ambulatory Visit: Payer: Medicaid Other | Attending: Neurology | Admitting: Physical Therapy

## 2023-07-08 ENCOUNTER — Encounter: Payer: Self-pay | Admitting: Physical Therapy

## 2023-07-08 DIAGNOSIS — M5416 Radiculopathy, lumbar region: Secondary | ICD-10-CM | POA: Diagnosis not present

## 2023-07-08 DIAGNOSIS — M48062 Spinal stenosis, lumbar region with neurogenic claudication: Secondary | ICD-10-CM | POA: Insufficient documentation

## 2023-07-08 DIAGNOSIS — M5459 Other low back pain: Secondary | ICD-10-CM | POA: Diagnosis present

## 2023-07-08 DIAGNOSIS — M545 Low back pain, unspecified: Secondary | ICD-10-CM | POA: Diagnosis not present

## 2023-07-08 DIAGNOSIS — R2689 Other abnormalities of gait and mobility: Secondary | ICD-10-CM | POA: Insufficient documentation

## 2023-07-08 DIAGNOSIS — G8929 Other chronic pain: Secondary | ICD-10-CM | POA: Diagnosis not present

## 2023-07-08 DIAGNOSIS — R29818 Other symptoms and signs involving the nervous system: Secondary | ICD-10-CM | POA: Diagnosis present

## 2023-07-08 DIAGNOSIS — M6281 Muscle weakness (generalized): Secondary | ICD-10-CM | POA: Diagnosis present

## 2023-07-08 NOTE — Therapy (Signed)
OUTPATIENT PHYSICAL THERAPY THORACOLUMBAR EVALUATION   Patient Name: Julie Kennedy MRN: [id number] DOB:1986-07-06, 37 y.o., female Today's Date: 07/08/2023  END OF SESSION:  PT End of Session - 07/08/23 1103     Visit Number 1    Number of Visits 12    Date for PT Re-Evaluation 08/19/23    Authorization Type [place] Medicaid wellcare    PT Start Time 1015    PT Stop Time 1055    PT Time Calculation (min) 40 min    Activity Tolerance Patient limited by pain    Behavior During Therapy WFL for tasks assessed/performed             Past Medical History:  Diagnosis Date   Anxiety    Depression    Frequent headaches    Hard of hearing    Hypertension    Past Surgical History:  Procedure Laterality Date   CESAREAN SECTION     CHOLECYSTECTOMY N/A 07/07/2018   Procedure: LAPAROSCOPIC CHOLECYSTECTOMY WITH INTRAOPERATIVE CHOLANGIOGRAM ERAS PATHWAY;  Surgeon: Griselda Miner, MD;  Location: [facility];  Service: General;  Laterality: N/A;   TONSILLECTOMY AND ADENOIDECTOMY     Patient Active Problem List   Diagnosis Date Noted   Spinal stenosis of lumbar region with neurogenic claudication 09/27/2022   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 09/27/2022   Vitamin B12 deficiency neuropathy (HCC) 09/27/2022   Polyneuropathy associated with underlying disease (HCC) 08/11/2020   B12 deficiency 08/11/2020   Paresthesias 07/08/2020   Neuropathic pain 07/08/2020   Thrombocytopenia (HCC) 01/28/2020   Normocytic anemia 01/28/2020   TIA (transient ischemic attack) 10/28/2018   Essential hypertension 04/27/2018   GAD (generalized anxiety disorder) 04/27/2018    PCP: Carmelia Roller  REFERRING PROVIDER: Naomie Dean  REFERRING DIAG: lumbar spinal stenosis  Rationale for Evaluation and Treatment: Rehabilitation  THERAPY DIAG:  Muscle weakness (generalized)  Other abnormalities of gait and mobility  ONSET DATE: 05/2023  SUBJECTIVE:                                                                                                                                                                                            SUBJECTIVE STATEMENT: Pt diagnosed with spinal stenosis in 2021 and she states it has been worsening for the past few years. She states she also has a B12 deficiency which causes neuropathy in both feet. She states she was doing well and doing PT until April 2024, then she got sick and had a set back. She is returning with c/o bilat LE fatigue as well as back pain. Pt states she can stand 5 minutes before fatigue,  she feels better with walking. She does get nervous about balance when walking in community. She uses SPC for gait in community but ambulates without it at home. Pt takes gabapentin at night due to neuropathy and hopes to be able to stop using it  PERTINENT HISTORY:  Neuropathy Spinal stenosis  PAIN:  Are you having pain?  Pt reports fatigue in LEs but no pain  PRECAUTIONS: None  RED FLAGS: None   WEIGHT BEARING RESTRICTIONS: No  FALLS:  Has patient fallen in last 6 months? Yes. 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  PLOF: Independent  PATIENT GOALS: improve strength and balance, not use gabapentin  NEXT MD VISIT: PRN  OBJECTIVE:   DIAGNOSTIC FINDINGS:  None current - lumbar MRI 2021 shows spinal stenosis L4-L5    COGNITION: Overall cognitive status: Within functional limits for tasks assessed     SENSATION: Neuropathy bilat LE    LUMBAR ROM:   AROM eval  Flexion   Extension   Right lateral flexion   Left lateral flexion   Right rotation   Left rotation    (Blank rows = not tested)   LOWER EXTREMITY MMT:    MMT Right eval Left eval  Hip flexion 3+ 3+  Hip extension    Hip  abduction    Hip adduction    Hip internal rotation    Hip external rotation    Knee flexion 4 4  Knee extension 4- 4-  Ankle dorsiflexion 3+ 3+  Ankle plantarflexion 3+ 3+  Ankle inversion    Ankle eversion     (Blank rows = not tested)   FUNCTIONAL TESTS:  5 times sit to stand: 27.28 seconds Timed up and go (TUG): 16.36 seconds BERG BALANCE TEST Sitting to Standing: 4.      Stands without using hands and stabilize independently Standing Unsupported: 3.      Stands 2 minutes with supervision Sitting Unsupported: 4.     Sits for 2 minutes independently Standing to Sitting: 2.     Uses back of legs against chair to control descent  Transfers: 3.     Transfers safely definite use of hands Standing with eyes closed: 1.     Unable to keep eyes closed for 3 seconds, but is safe Standing with feet together: 1.     Needs help to attain position but can hold for 15 seconds Reaching forward with outstretched arm: 3.     Reaches forward 5 inches Retrieving object from the floor: 4.      Able to pick up easily and safely Turning to look behind: 4.     Looks behind from both sides and weight shifts well Turning 360 degrees: 2.     Able to turn slowly, but safely Place alternate foot on stool: 1.     Completes >2 steps with minimal assist Standing with one foot in front: 1.     Needs help to step, but can hold for 15 seconds Standing on one foot: 0.     Unable  Total Score: 33/56   GAIT: Distance walked: 100' Assistive device utilized: Single point cane Level of assistance: Modified independence Comments: decreased cadence, decreased knee flexion during gait, wide BOS  TODAY'S TREATMENT:  DATE: 07/08/23 See HEP    PATIENT EDUCATION:  Education details: PT POC and goals, HEP Person educated: Patient Education method: Explanation, Demonstration, and  Handouts Education comprehension: verbalized understanding and returned demonstration  HOME EXERCISE PROGRAM: Access Code: [email or web address] URL: [email or web address] Date: 07/08/2023 Prepared by: Reggy Eye  Exercises - Hip Abduction with Resistance Loop  - 1 x daily - 7 x weekly - 3 sets - 10 reps - Hip Extension with Resistance Loop  - 1 x daily - 7 x weekly - 3 sets - 10 reps - Standing Hamstring Curl with Resistance  - 1 x daily - 7 x weekly - 3 sets - 10 reps - Sitting Knee Extension with Resistance  - 1 x daily - 7 x weekly - 3 sets - 10 reps - Sit to Stand Without Arm Support  - 1 x daily - 7 x weekly - 3 sets - 5 reps - Standing Tandem Balance with Counter Support  - 1 x daily - 7 x weekly - 1 sets - 3 reps - 30 sec hold  ASSESSMENT:  CLINICAL IMPRESSION: Patient is a 37 y.o. female who was seen today for physical therapy evaluation and treatment for lumbar spinal stenosis. Pt presents with decreased strength, balance, impaired gait and mobility and decreased activity tolerance. Pt will benefit from skilled PT to address deficits and improve functional mobility.   OBJECTIVE IMPAIRMENTS: decreased activity tolerance, decreased balance, decreased endurance, difficulty walking, and decreased strength.   ACTIVITY LIMITATIONS: standing, transfers, and locomotion level  PARTICIPATION LIMITATIONS: meal prep, cleaning, and community activity  PERSONAL FACTORS: Time since onset of injury/illness/exacerbation and 1-2 comorbidities: spinal stenosis, neuropathy  are also affecting patient's functional outcome.   REHAB POTENTIAL: Good  CLINICAL DECISION MAKING: Evolving/moderate complexity  EVALUATION COMPLEXITY: Moderate   GOALS: Goals reviewed with patient? Yes  SHORT TERM GOALS: Target date: 07/22/2023    Pt will be independent with initial HEP Baseline: Goal status: INITIAL  2.  Pt will improve 5 x STS to <= to 24 seconds to demo improving strength Baseline:   Goal status: INITIAL   LONG TERM GOALS: Target date: 08/19/2023    Pt will be independent with advanced HEP Baseline:  Goal status: INITIAL  2.  Pt will improve 5 x STS to <= 21 seconds to demo improved strength Baseline:  Goal status: INITIAL  3.  Pt will demo improved gait by performing TUG in <= 11 seconds Baseline:  Goal status: INITIAL  4.  Pt will demo improved balance by scoring >= 40 on Berg balance test Baseline:  Goal status: INITIAL  5.  Pt will report being able to walk and stand x 15 minutes before needing to rest Baseline:  Goal status: INITIAL   PLAN:  PT FREQUENCY: 2x/week  PT DURATION: 6 weeks  PLANNED INTERVENTIONS: Therapeutic exercises, Therapeutic activity, Neuromuscular re-education, Balance training, Gait training, Patient/Family education, Self Care, Joint mobilization, Aquatic Therapy, Dry Needling, Electrical stimulation, Cryotherapy, Moist heat, Taping, Vasopneumatic device, Ionotophoresis 4mg /ml Dexamethasone, Manual therapy, and Re-evaluation.  PLAN FOR NEXT SESSION: assess response to HEP, general LE strength and conditioning, balance   Shanette Tamargo, PT 07/08/2023, 11:04 AM

## 2023-07-11 ENCOUNTER — Ambulatory Visit: Payer: Medicaid Other | Admitting: Rehabilitative and Restorative Service Providers"

## 2023-07-11 NOTE — Therapy (Deleted)
OUTPATIENT PHYSICAL THERAPY THORACOLUMBAR TREATMENT   Patient Name: Julie Kennedy MRN: [id number] DOB:1986-08-07, 37 y.o., female Today's Date: 07/11/2023  END OF SESSION:    Past Medical History:  Diagnosis Date   Anxiety    Depression    Frequent headaches    Hard of hearing    Hypertension    Past Surgical History:  Procedure Laterality Date   CESAREAN SECTION     CHOLECYSTECTOMY N/A 07/07/2018   Procedure: LAPAROSCOPIC CHOLECYSTECTOMY WITH INTRAOPERATIVE CHOLANGIOGRAM ERAS PATHWAY;  Surgeon: Griselda Miner, MD;  Location: [facility];  Service: General;  Laterality: N/A;   TONSILLECTOMY AND ADENOIDECTOMY     Patient Active Problem List   Diagnosis Date Noted   Spinal stenosis of lumbar region with neurogenic claudication 09/27/2022   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 09/27/2022   Vitamin B12 deficiency neuropathy (HCC) 09/27/2022   Polyneuropathy associated with underlying disease (HCC) 08/11/2020   B12 deficiency 08/11/2020   Paresthesias 07/08/2020   Neuropathic pain 07/08/2020   Thrombocytopenia (HCC) 01/28/2020   Normocytic anemia 01/28/2020   TIA (transient ischemic attack) 10/28/2018   Essential hypertension 04/27/2018   GAD (generalized anxiety disorder) 04/27/2018    PCP: Carmelia Roller  REFERRING PROVIDER: Naomie Dean  REFERRING DIAG: lumbar spinal stenosis  Rationale for Evaluation and Treatment: Rehabilitation  THERAPY DIAG:  No diagnosis found.  ONSET DATE: 05/2023  SUBJECTIVE:                                                                                                                                                                                           SUBJECTIVE STATEMENT: Pt diagnosed with spinal stenosis in 2021 and she states it has been worsening for the past few years. She states she also has a B12 deficiency which causes neuropathy in both feet. She states she was doing well and doing PT until April 2024, then  she got sick and had a set back. She is returning with c/o bilat LE fatigue as well as back pain. Pt states she can stand 5 minutes before fatigue, she feels better with walking. She does get nervous about balance when walking in community. She uses SPC for gait in community but ambulates without it at home. Pt takes gabapentin at night due to neuropathy and hopes to be able to stop using it  PERTINENT HISTORY:  Neuropathy Spinal stenosis  PAIN:  Are you having pain?  Pt reports fatigue in LEs but no pain  PRECAUTIONS: None  RED FLAGS: None   WEIGHT BEARING RESTRICTIONS: No  FALLS:  Has patient fallen in last 6 months? Yes. 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  PLOF: Independent  PATIENT GOALS: improve strength and balance, not use gabapentin  NEXT MD VISIT: PRN  OBJECTIVE:   DIAGNOSTIC FINDINGS:  None current - lumbar MRI 2021 shows spinal stenosis L4-L5    COGNITION: Overall cognitive status: Within functional limits for tasks assessed     SENSATION: Neuropathy bilat LE    LUMBAR ROM:   AROM eval  Flexion   Extension   Right lateral flexion   Left lateral flexion   Right rotation   Left rotation    (Blank rows = not tested)   LOWER EXTREMITY MMT:    MMT Right eval Left eval  Hip flexion 3+ 3+  Hip extension    Hip abduction    Hip adduction    Hip internal rotation    Hip external rotation    Knee flexion 4 4  Knee extension 4- 4-  Ankle dorsiflexion 3+ 3+  Ankle plantarflexion 3+ 3+  Ankle inversion    Ankle eversion     (Blank rows = not tested)   FUNCTIONAL TESTS:  5 times sit to stand: 27.28 seconds Timed up and go (TUG): 16.36 seconds BERG BALANCE TEST Sitting to Standing: 4.      Stands without using hands and  stabilize independently Standing Unsupported: 3.      Stands 2 minutes with supervision Sitting Unsupported: 4.     Sits for 2 minutes independently Standing to Sitting: 2.     Uses back of legs against chair to control descent  Transfers: 3.     Transfers safely definite use of hands Standing with eyes closed: 1.     Unable to keep eyes closed for 3 seconds, but is safe Standing with feet together: 1.     Needs help to attain position but can hold for 15 seconds Reaching forward with outstretched arm: 3.     Reaches forward 5 inches Retrieving object from the floor: 4.      Able to pick up easily and safely Turning to look behind: 4.     Looks behind from both sides and weight shifts well Turning 360 degrees: 2.     Able to turn slowly, but safely Place alternate foot on stool: 1.     Completes >2 steps with minimal assist Standing with one foot in front: 1.     Needs help to step, but can hold for 15 seconds Standing on one foot: 0.     Unable  Total Score: 33/56   GAIT: Distance walked: 100' Assistive device utilized: Single point cane Level of assistance: Modified independence Comments: decreased cadence, decreased knee flexion during gait, wide BOS  TODAY'S TREATMENT:  DATE: 07/08/23 See HEP    PATIENT EDUCATION:  Education details: PT POC and goals, HEP Person educated: Patient Education method: Explanation, Demonstration, and Handouts Education comprehension: verbalized understanding and returned demonstration  HOME EXERCISE PROGRAM: Access Code: [email or web address] URL: [email or web address] Date: 07/08/2023 Prepared by: Reggy Eye  Exercises - Hip Abduction with Resistance Loop  - 1 x daily - 7 x weekly - 3 sets - 10 reps - Hip Extension with Resistance Loop  - 1 x daily - 7 x weekly - 3 sets - 10 reps - Standing Hamstring Curl with  Resistance  - 1 x daily - 7 x weekly - 3 sets - 10 reps - Sitting Knee Extension with Resistance  - 1 x daily - 7 x weekly - 3 sets - 10 reps - Sit to Stand Without Arm Support  - 1 x daily - 7 x weekly - 3 sets - 5 reps - Standing Tandem Balance with Counter Support  - 1 x daily - 7 x weekly - 1 sets - 3 reps - 30 sec hold  ASSESSMENT:  CLINICAL IMPRESSION: Patient is a 37 y.o. female who was seen today for physical therapy evaluation and treatment for lumbar spinal stenosis. Pt presents with decreased strength, balance, impaired gait and mobility and decreased activity tolerance. Pt will benefit from skilled PT to address deficits and improve functional mobility.   OBJECTIVE IMPAIRMENTS: decreased activity tolerance, decreased balance, decreased endurance, difficulty walking, and decreased strength.   ACTIVITY LIMITATIONS: standing, transfers, and locomotion level  PARTICIPATION LIMITATIONS: meal prep, cleaning, and community activity  PERSONAL FACTORS: Time since onset of injury/illness/exacerbation and 1-2 comorbidities: spinal stenosis, neuropathy  are also affecting patient's functional outcome.   REHAB POTENTIAL: Good  CLINICAL DECISION MAKING: Evolving/moderate complexity  EVALUATION COMPLEXITY: Moderate   GOALS: Goals reviewed with patient? Yes  SHORT TERM GOALS: Target date: 07/22/2023    Pt will be independent with initial HEP Baseline: Goal status: INITIAL  2.  Pt will improve 5 x STS to <= to 24 seconds to demo improving strength Baseline:  Goal status: INITIAL   LONG TERM GOALS: Target date: 08/19/2023    Pt will be independent with advanced HEP Baseline:  Goal status: INITIAL  2.  Pt will improve 5 x STS to <= 21 seconds to demo improved strength Baseline:  Goal status: INITIAL  3.  Pt will demo improved gait by performing TUG in <= 11 seconds Baseline:  Goal status: INITIAL  4.  Pt will demo improved balance by scoring >= 40 on Berg balance  test Baseline:  Goal status: INITIAL  5.  Pt will report being able to walk and stand x 15 minutes before needing to rest Baseline:  Goal status: INITIAL   PLAN:  PT FREQUENCY: 2x/week  PT DURATION: 6 weeks  PLANNED INTERVENTIONS: Therapeutic exercises, Therapeutic activity, Neuromuscular re-education, Balance training, Gait training, Patient/Family education, Self Care, Joint mobilization, Aquatic Therapy, Dry Needling, Electrical stimulation, Cryotherapy, Moist heat, Taping, Vasopneumatic device, Ionotophoresis 4mg /ml Dexamethasone, Manual therapy, and Re-evaluation.  PLAN FOR NEXT SESSION: assess response to HEP, general LE strength and conditioning, balance   Idan Prime, PT 07/11/2023, 1:04 PM

## 2023-07-13 ENCOUNTER — Ambulatory Visit: Payer: Medicaid Other

## 2023-07-15 ENCOUNTER — Ambulatory Visit: Payer: Medicaid Other | Admitting: Rehabilitative and Restorative Service Providers"

## 2023-07-19 ENCOUNTER — Ambulatory Visit: Payer: Medicaid Other | Admitting: Physical Therapy

## 2023-07-21 ENCOUNTER — Ambulatory Visit: Payer: Medicaid Other

## 2023-07-21 DIAGNOSIS — M6281 Muscle weakness (generalized): Secondary | ICD-10-CM

## 2023-07-21 DIAGNOSIS — R29818 Other symptoms and signs involving the nervous system: Secondary | ICD-10-CM

## 2023-07-21 DIAGNOSIS — M5459 Other low back pain: Secondary | ICD-10-CM

## 2023-07-21 DIAGNOSIS — R2689 Other abnormalities of gait and mobility: Secondary | ICD-10-CM

## 2023-07-21 NOTE — Therapy (Signed)
OUTPATIENT PHYSICAL THERAPY THORACOLUMBAR TREATMENT   Patient Name: Julie Kennedy MRN: [id number] DOB:08/14/1986, 37 y.o., female Today's Date: 07/21/2023  END OF SESSION:  PT End of Session - 07/21/23 0937     Visit Number 2    Number of Visits 12    Date for PT Re-Evaluation 08/19/23    Authorization Type [place] Medicaid [employer]    Authorization Time Period 6 VISITS APPROVED FOR PT 07/11/2023-09/23/2023    Authorization - Visit Number 2    Authorization - Number of Visits 6    PT Start Time [phone]    PT Stop Time 1016    PT Time Calculation (min) 38 min    Activity Tolerance Patient tolerated treatment well    Behavior During Therapy WFL for tasks assessed/performed              Past Medical History:  Diagnosis Date   Anxiety    Depression    Frequent headaches    Hard of hearing    Hypertension    Past Surgical History:  Procedure Laterality Date   CESAREAN SECTION     CHOLECYSTECTOMY N/A 07/07/2018   Procedure: LAPAROSCOPIC CHOLECYSTECTOMY WITH INTRAOPERATIVE CHOLANGIOGRAM ERAS PATHWAY;  Surgeon: Griselda Miner, MD;  Location: [facility];  Service: General;  Laterality: N/A;   TONSILLECTOMY AND ADENOIDECTOMY     Patient Active Problem List   Diagnosis Date Noted   Spinal stenosis of lumbar region with neurogenic claudication 09/27/2022   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 09/27/2022   Vitamin B12 deficiency neuropathy (HCC) 09/27/2022   Polyneuropathy associated with underlying disease (HCC) 08/11/2020   B12 deficiency 08/11/2020   Paresthesias 07/08/2020   Neuropathic pain 07/08/2020   Thrombocytopenia (HCC) 01/28/2020   Normocytic anemia 01/28/2020   TIA (transient ischemic attack) 10/28/2018   Essential hypertension 04/27/2018   GAD (generalized anxiety disorder) 04/27/2018    PCP: Carmelia Roller  REFERRING PROVIDER: Naomie Dean  REFERRING DIAG: lumbar spinal stenosis  Rationale for Evaluation and Treatment:  Rehabilitation  THERAPY DIAG:  Muscle weakness (generalized)  Other abnormalities of gait and mobility  Other low back pain  Other symptoms and signs involving the nervous system  ONSET DATE: 05/2023  SUBJECTIVE:                                                                                                                                                                                           SUBJECTIVE STATEMENT: Patient states she is trying to wean herself off gabapentin, states the neuropathy is worse at night. Patient reports her back is aching more on L side. Patient states she ususally has  help walking from parking lot to clinic but had to do it alone today and it took her a long time. Patient states she stopped taking B12 supplement due to her levels being too high as measured at last PCP appt. Patient states her main issue is her legs and fatigue/weakness; states she has been doing HEP for approx 20 min each day.  EVAL: Pt diagnosed with spinal stenosis in 2021 and she states it has been worsening for the past few years. She states she also has a B12 deficiency which causes neuropathy in both feet. She states she was doing well and doing PT until April 2024, then she got sick and had a set back. She is returning with c/o bilat LE fatigue as well as back pain. Pt states she can stand 5 minutes before fatigue, she feels better with walking. She does get nervous about balance when walking in community. She uses SPC for gait in community but ambulates without it at home. Pt takes gabapentin at night due to neuropathy and hopes to be able to stop using it  PERTINENT HISTORY:  Neuropathy Spinal stenosis  PAIN:  Are you having pain?  Pt reports fatigue in LEs but no pain  PRECAUTIONS: None  RED FLAGS: None   WEIGHT BEARING RESTRICTIONS: No  FALLS:  Has patient fallen in last 6 months? Yes. 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  PLOF: Independent  PATIENT GOALS: improve strength and balance, not use gabapentin  NEXT MD VISIT: PRN  OBJECTIVE:   DIAGNOSTIC FINDINGS:  None current - lumbar MRI 2021 shows spinal stenosis L4-L5   COGNITION: Overall cognitive status: Within functional limits for tasks assessed     SENSATION: Neuropathy bilat LE    LUMBAR ROM:   AROM eval  Flexion   Extension   Right lateral flexion   Left lateral flexion   Right rotation   Left rotation    (Blank rows = not tested)   LOWER EXTREMITY MMT:    MMT Right eval Left eval  Hip flexion 3+ 3+  Hip extension    Hip abduction    Hip adduction    Hip internal rotation    Hip external rotation    Knee flexion 4 4  Knee extension 4- 4-  Ankle dorsiflexion 3+ 3+  Ankle plantarflexion 3+ 3+  Ankle inversion    Ankle eversion     (Blank rows = not tested)   FUNCTIONAL TESTS:  5 times sit to stand: 27.28 seconds Timed up and go (TUG): 16.36 seconds BERG BALANCE TEST Sitting to Standing: 4.      Stands without using hands and stabilize independently Standing Unsupported: 3.      Stands 2 minutes with supervision Sitting Unsupported: 4.     Sits for 2 minutes independently Standing to Sitting: 2.     Uses back of legs against chair to control descent  Transfers: 3.     Transfers safely definite use of hands Standing with eyes closed: 1.     Unable to keep eyes closed for 3 seconds, but is safe Standing with feet together: 1.     Needs help to attain position but can hold for 15 seconds Reaching forward with outstretched arm: 3.  Reaches forward 5 inches Retrieving object from the floor: 4.      Able to pick up easily and safely Turning to look behind: 4.     Looks behind from both sides and weight shifts well Turning 360 degrees: 2.     Able  to turn slowly, but safely Place alternate foot on stool: 1.     Completes >2 steps with minimal assist Standing with one foot in front: 1.     Needs help to step, but can hold for 15 seconds Standing on one foot: 0.     Unable  Total Score: 33/56   GAIT: Distance walked: 100' Assistive device utilized: Single point cane Level of assistance: Modified independence Comments: decreased cadence, decreased knee flexion during gait, wide BOS  TODAY'S TREATMENT:   [facility] Adult PT Treatment:                                                DATE: 07/21/2023 Therapeutic Exercise: STSx5 (timed) --> STSx5 placed on corner of table Seated knee extension RTB x15 (B) Side Lying: Clamshells x10 Straight leg hip abd x8 Bent knee hip extension x8 Standing bent knee hip extension x10 (B) Walking + head turns R/L                                                                                                                               DATE: 07/08/23 See HEP    PATIENT EDUCATION:  Education details: PT POC and goals, HEP Person educated: Patient Education method: Explanation, Demonstration, and Handouts Education comprehension: verbalized understanding and returned demonstration  HOME EXERCISE PROGRAM: Access Code: [email or web address] URL: [email or web address] Date: 07/21/2023 Prepared by: Carlynn Herald  Exercises - Hip Abduction with Resistance Loop  - 1 x daily - 7 x weekly - 3 sets - 10 reps - Hip Extension with Resistance Loop  - 1 x daily - 7 x weekly - 3 sets - 10 reps - Standing Hamstring Curl with Resistance  - 1 x daily - 7 x weekly - 3 sets - 10 reps - Sitting Knee Extension with Resistance  - 1 x daily - 7 x weekly - 3 sets - 10 reps - Sit to Stand Without Arm Support  - 1 x daily - 7 x weekly - 3 sets - 5 reps - Standing Tandem Balance with Counter Support  - 1 x daily - 7 x weekly - 1 sets - 3 reps - 30 sec hold - Clamshell  - 1 x daily - 7 x weekly - 3 sets - 10 reps -  Sidelying Hip Abduction  - 1 x daily - 7 x weekly - 3 sets - 10 reps - Standing Hip Extension with Leg Bent and Support  - 1 x daily - 7 x weekly - 3 sets -  10 reps - Walking with Head Rotation  - 1 x daily - 7 x weekly - 3 sets - 10 reps  ASSESSMENT:  CLINICAL IMPRESSION: Short-term goals updated and met. Hip strengthening progressed with hip abd and extension variations in side lying and standing. Patient exhibited mild anxiety during walking with head due to instability; recommended patient practice activity at home in hallway for support as needed.   OBJECTIVE IMPAIRMENTS: decreased activity tolerance, decreased balance, decreased endurance, difficulty walking, and decreased strength.   ACTIVITY LIMITATIONS: standing, transfers, and locomotion level  PARTICIPATION LIMITATIONS: meal prep, cleaning, and community activity  PERSONAL FACTORS: Time since onset of injury/illness/exacerbation and 1-2 comorbidities: spinal stenosis, neuropathy  are also affecting patient's functional outcome.   REHAB POTENTIAL: Good  CLINICAL DECISION MAKING: Evolving/moderate complexity  EVALUATION COMPLEXITY: Moderate   GOALS: Goals reviewed with patient? Yes  SHORT TERM GOALS: Target date: 07/22/2023  Pt will be independent with initial HEP Baseline: Goal status: MET  2.  Pt will improve 5 x STS to <= to 24 seconds to demo improving strength Baseline: 19 sec Goal status: MET   LONG TERM GOALS: Target date: 08/19/2023  Pt will be independent with advanced HEP Baseline:  Goal status: INITIAL  2.  Pt will improve 5 x STS to <= 21 seconds to demo improved strength Baseline:  Goal status: INITIAL  3.  Pt will demo improved gait by performing TUG in <= 11 seconds Baseline:  Goal status: INITIAL  4.  Pt will demo improved balance by scoring >= 40 on Berg balance test Baseline:  Goal status: INITIAL  5.  Pt will report being able to walk and stand x 15 minutes before needing to  rest Baseline:  Goal status: INITIAL   PLAN:  PT FREQUENCY: 2x/week  PT DURATION: 6 weeks  PLANNED INTERVENTIONS: Therapeutic exercises, Therapeutic activity, Neuromuscular re-education, Balance training, Gait training, Patient/Family education, Self Care, Joint mobilization, Aquatic Therapy, Dry Needling, Electrical stimulation, Cryotherapy, Moist heat, Taping, Vasopneumatic device, Ionotophoresis 4mg /ml Dexamethasone, Manual therapy, and Re-evaluation.  PLAN FOR NEXT SESSION: Continue general LE strength and conditioning, balance   Sanjuana Mae, PTA 07/21/2023, 11:04 AM

## 2023-07-26 ENCOUNTER — Encounter: Payer: Self-pay | Admitting: Rehabilitative and Restorative Service Providers"

## 2023-07-26 ENCOUNTER — Ambulatory Visit: Payer: Medicaid Other | Attending: Neurology | Admitting: Rehabilitative and Restorative Service Providers"

## 2023-07-26 DIAGNOSIS — M5459 Other low back pain: Secondary | ICD-10-CM | POA: Insufficient documentation

## 2023-07-26 DIAGNOSIS — M6281 Muscle weakness (generalized): Secondary | ICD-10-CM | POA: Insufficient documentation

## 2023-07-26 DIAGNOSIS — R29818 Other symptoms and signs involving the nervous system: Secondary | ICD-10-CM | POA: Diagnosis present

## 2023-07-26 DIAGNOSIS — R2689 Other abnormalities of gait and mobility: Secondary | ICD-10-CM | POA: Insufficient documentation

## 2023-07-26 NOTE — Therapy (Signed)
OUTPATIENT PHYSICAL THERAPY THORACOLUMBAR TREATMENT   Patient Name: Julie Kennedy MRN: [id number] DOB:[birth year], 37 y.o., female Today's Date: 07/26/2023  END OF SESSION:  PT End of Session - 07/26/23 1014     Visit Number 3    Number of Visits 12    Date for PT Re-Evaluation 08/19/23    Authorization Type [place] Medicaid wellcare    Authorization Time Period 6 VISITS APPROVED FOR PT 07/11/2023-09/23/2023    Authorization - Visit Number 3    Authorization - Number of Visits 6    PT Start Time 1013    PT Stop Time 1055    PT Time Calculation (min) 42 min    Activity Tolerance Patient tolerated treatment well              Past Medical History:  Diagnosis Date   Anxiety    Depression    Frequent headaches    Hard of hearing    Hypertension    Past Surgical History:  Procedure Laterality Date   CESAREAN SECTION     CHOLECYSTECTOMY N/A 07/07/2018   Procedure: LAPAROSCOPIC CHOLECYSTECTOMY WITH INTRAOPERATIVE CHOLANGIOGRAM ERAS PATHWAY;  Surgeon: Griselda Miner, MD;  Location: [facility];  Service: General;  Laterality: N/A;   TONSILLECTOMY AND ADENOIDECTOMY     Patient Active Problem List   Diagnosis Date Noted   Spinal stenosis of lumbar region with neurogenic claudication 09/27/2022   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 09/27/2022   Vitamin B12 deficiency neuropathy (HCC) 09/27/2022   Polyneuropathy associated with underlying disease (HCC) 08/11/2020   B12 deficiency 08/11/2020   Paresthesias 07/08/2020   Neuropathic pain 07/08/2020   Thrombocytopenia (HCC) 01/28/2020   Normocytic anemia 01/28/2020   TIA (transient ischemic attack) 10/28/2018   Essential hypertension 04/27/2018   GAD (generalized anxiety disorder) 04/27/2018    PCP: Carmelia Roller  REFERRING PROVIDER: Naomie Dean  REFERRING DIAG: lumbar spinal stenosis  Rationale for Evaluation and Treatment: Rehabilitation  THERAPY DIAG:  Muscle weakness (generalized)  Other  abnormalities of gait and mobility  Other low back pain  Other symptoms and signs involving the nervous system  ONSET DATE: 05/2023  SUBJECTIVE:                                                                                                                                                                                           SUBJECTIVE STATEMENT: Patient reports that she is having a hard day today. She has been more active and walking more in the past few days. She can't lie on her belly due to stomach problems. Patient states she is taking gabapentin at night, states  the neuropathy is worse at night. Patient reports her back is aching more on L side. She has purchased a lumbar support - discussed using the brace and the fact that bracing the back will make the back weaker. Patient states she stopped taking B12 supplement due to her levels being too high as measured at last PCP appt. Patient states her main issue is her legs and fatigue/weakness; states she has been doing HEP for approx 20 min each day.  EVAL: Pt diagnosed with spinal stenosis in 2021 and she states it has been worsening for the past few years. She states she also has a B12 deficiency which causes neuropathy in both feet. She states she was doing well and doing PT until April 2024, then she got sick and had a set back. She is returning with c/o bilat LE fatigue as well as back pain. Pt states she can stand 5 minutes before fatigue, she feels better with walking. She does get nervous about balance when walking in community. She uses SPC for gait in community but ambulates without it at home. Pt takes gabapentin at night due to neuropathy and hopes to be able to stop using it  PERTINENT HISTORY:  Neuropathy Spinal stenosis  PAIN:  Are you having pain? Pt reports fatigue in LEs but no pain - legs are tired   PRECAUTIONS: None   WEIGHT BEARING RESTRICTIONS: No  FALLS:  Has patient fallen in last 6 months? Yes. 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  PATIENT GOALS: improve strength and balance, not use gabapentin  NEXT MD VISIT: PRN  OBJECTIVE:   DIAGNOSTIC FINDINGS:  None current - lumbar MRI 2021 shows spinal stenosis L4-L5    SENSATION: Neuropathy bilat LE  LUMBAR ROM:   AROM eval  Flexion   Extension   Right lateral flexion   Left lateral flexion   Right rotation   Left rotation    (Blank rows = not tested)   LOWER EXTREMITY MMT:    MMT Right eval Left eval  Hip flexion 3+ 3+  Hip extension    Hip abduction    Hip adduction    Hip internal rotation    Hip external rotation    Knee flexion 4 4  Knee extension 4- 4-  Ankle dorsiflexion 3+ 3+  Ankle plantarflexion 3+ 3+  Ankle inversion    Ankle eversion     (Blank rows = not tested)   FUNCTIONAL TESTS:  5 times sit to stand: 27.28 seconds Timed up and go (TUG): 16.36 seconds BERG BALANCE TEST Sitting to Standing: 4.      Stands without using hands and stabilize independently Standing Unsupported: 3.      Stands 2 minutes with supervision Sitting Unsupported: 4.     Sits for 2 minutes independently Standing to Sitting: 2.     Uses back of legs against chair to control descent  Transfers: 3.     Transfers safely definite use of hands Standing with eyes closed: 1.     Unable to keep eyes closed for 3 seconds, but is safe Standing with feet together: 1.     Needs help to attain position but can hold for 15 seconds Reaching forward with outstretched arm: 3.  Reaches forward 5 inches Retrieving object from the floor: 4.      Able to pick up easily and safely Turning to look behind: 4.     Looks behind from both sides and weight shifts well Turning 360 degrees: 2.     Able to turn slowly, but  safely Place alternate foot on stool: 1.     Completes >2 steps with minimal assist Standing with one foot in front: 1.     Needs help to step, but can hold for 15 seconds Standing on one foot: 0.     Unable  Total Score: 33/56   GAIT: Distance walked: 100' Assistive device utilized: Single point cane Level of assistance: Modified independence Comments: decreased cadence, decreased knee flexion during gait, wide BOS  TODAY'S TREATMENT:   [facility] Adult PT Treatment:                                                DATE: 07/26/2023 Therapeutic Exercise: Seated  Sit to stand x 10 with rest during reps  Ankle DF green TB 3 sec x 10  Side Lying: Clamshells x 10 R/L Straight leg hip abd x 10 R/L Bent knee hip extension x 10 R/L Standing (holding treadmill rail)  Heel raise bilat x 10  Marching x 20 R/L  Mini knee bends x 10    TODAY'S TREATMENT:   [facility] Adult PT Treatment:                                                DATE: 07/21/2023 Therapeutic Exercise: STSx5 (timed) --> STSx5 placed on corner of table Seated knee extension RTB x15 (B) Side Lying: Clamshells x10 Straight leg hip abd x8 Bent knee hip extension x8 Standing bent knee hip extension x10 (B) Walking + head turns R/L   PATIENT EDUCATION:  Education details: PT POC and goals, HEP Person educated: Patient Education method: Programmer, multimedia, Facilities manager, and Handouts Education comprehension: verbalized understanding and returned demonstration  HOME EXERCISE PROGRAM: Access Code: [email or web address] URL: [email or web address] Date: 07/21/2023 Prepared by: Carlynn Herald  Exercises - Hip Abduction with Resistance Loop  - 1 x daily - 7 x weekly - 3 sets - 10 reps - Hip Extension with Resistance Loop  - 1 x daily - 7 x weekly - 3 sets - 10 reps - Standing Hamstring Curl with Resistance  - 1 x daily - 7 x weekly - 3 sets - 10 reps - Sitting Knee Extension with Resistance  - 1 x daily - 7 x weekly - 3 sets - 10  reps - Sit to Stand Without Arm Support  - 1 x daily - 7 x weekly - 3 sets - 5 reps - Standing Tandem Balance with Counter Support  - 1 x daily - 7 x weekly - 1 sets - 3 reps - 30 sec hold - Clamshell  - 1 x daily - 7 x weekly - 3 sets - 10 reps - Sidelying Hip Abduction  - 1 x daily - 7 x weekly - 3 sets - 10 reps - Standing Hip Extension with Leg Bent and Support  - 1 x daily - 7 x weekly - 3 sets - 10 reps - Walking with Head  Rotation  - 1 x daily - 7 x weekly - 3 sets - 10 reps  ASSESSMENT:  CLINICAL IMPRESSION: Limited exercise tolerance today due to fatigue.  Continued hip strengthening with hip abd and extension variations in side lying and standing.   GOALS: Goals reviewed with patient? Yes  SHORT TERM GOALS: Target date: 07/22/2023  Pt will be independent with initial HEP Baseline: Goal status: MET  2.  Pt will improve 5 x STS to <= to 24 seconds to demo improving strength Baseline: 19 sec Goal status: MET   LONG TERM GOALS: Target date: 08/19/2023  Pt will be independent with advanced HEP Baseline:  Goal status: INITIAL  2.  Pt will improve 5 x STS to <= 21 seconds to demo improved strength Baseline:  Goal status: INITIAL  3.  Pt will demo improved gait by performing TUG in <= 11 seconds Baseline:  Goal status: INITIAL  4.  Pt will demo improved balance by scoring >= 40 on Berg balance test Baseline:  Goal status: INITIAL  5.  Pt will report being able to walk and stand x 15 minutes before needing to rest Baseline:  Goal status: INITIAL   PLAN:  PT FREQUENCY: 2x/week  PT DURATION: 6 weeks  PLANNED INTERVENTIONS: Therapeutic exercises, Therapeutic activity, Neuromuscular re-education, Balance training, Gait training, Patient/Family education, Self Care, Joint mobilization, Aquatic Therapy, Dry Needling, Electrical stimulation, Cryotherapy, Moist heat, Taping, Vasopneumatic device, Ionotophoresis 4mg /ml Dexamethasone, Manual therapy, and  Re-evaluation.  PLAN FOR NEXT SESSION: Continue general LE strength and conditioning, balance   Joyia Riehle Rober Minion, PT 07/26/2023, 10:15 AM

## 2023-07-29 ENCOUNTER — Encounter: Payer: Medicaid Other | Admitting: Rehabilitative and Restorative Service Providers"

## 2023-08-01 ENCOUNTER — Ambulatory Visit: Payer: Medicaid Other

## 2023-08-01 DIAGNOSIS — R29818 Other symptoms and signs involving the nervous system: Secondary | ICD-10-CM

## 2023-08-01 DIAGNOSIS — M6281 Muscle weakness (generalized): Secondary | ICD-10-CM

## 2023-08-01 DIAGNOSIS — R2689 Other abnormalities of gait and mobility: Secondary | ICD-10-CM

## 2023-08-01 DIAGNOSIS — M5459 Other low back pain: Secondary | ICD-10-CM

## 2023-08-01 NOTE — Therapy (Addendum)
 OUTPATIENT PHYSICAL THERAPY THORACOLUMBAR TREATMENT AND DISCHARGE   Patient Name: Julie Kennedy MRN: [id number] DOB:1986-08-31, 37 y.o., female Today's Date: 08/01/2023  END OF SESSION:  PT End of Session - 08/01/23 1023     Visit Number 4    Number of Visits 12    Date for PT Re-Evaluation 08/19/23    Authorization Type [place] Medicaid wellcare    Authorization Time Period 6 VISITS APPROVED FOR PT 07/11/2023-09/23/2023    Authorization - Visit Number 3    Authorization - Number of Visits 6    PT Start Time 1023   pt arrived late   PT Stop Time 1058    PT Time Calculation (min) 35 min    Activity Tolerance Patient tolerated treatment well    Behavior During Therapy WFL for tasks assessed/performed              Past Medical History:  Diagnosis Date   Anxiety    Depression    Frequent headaches    Hard of hearing    Hypertension    Past Surgical History:  Procedure Laterality Date   CESAREAN SECTION     CHOLECYSTECTOMY N/A 07/07/2018   Procedure: LAPAROSCOPIC CHOLECYSTECTOMY WITH INTRAOPERATIVE CHOLANGIOGRAM ERAS PATHWAY;  Surgeon: Griselda Miner, MD;  Location: [facility];  Service: General;  Laterality: N/A;   TONSILLECTOMY AND ADENOIDECTOMY     Patient Active Problem List   Diagnosis Date Noted   Spinal stenosis of lumbar region with neurogenic claudication 09/27/2022   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 09/27/2022   Vitamin B12 deficiency neuropathy (HCC) 09/27/2022   Polyneuropathy associated with underlying disease (HCC) 08/11/2020   B12 deficiency 08/11/2020   Paresthesias 07/08/2020   Neuropathic pain 07/08/2020   Thrombocytopenia (HCC) 01/28/2020   Normocytic anemia 01/28/2020   TIA (transient ischemic attack) 10/28/2018   Essential hypertension 04/27/2018   GAD (generalized anxiety disorder) 04/27/2018    PCP: Carmelia Roller  REFERRING PROVIDER: Naomie Dean  REFERRING DIAG: lumbar spinal stenosis  Rationale for Evaluation and  Treatment: Rehabilitation  THERAPY DIAG:  Muscle weakness (generalized)  Other abnormalities of gait and mobility  Other low back pain  Other symptoms and signs involving the nervous system  ONSET DATE: 05/2023  SUBJECTIVE:                                                                                                                                                                                           SUBJECTIVE STATEMENT: Patient reports her stomach is still having stomach discomfort and it is keeping her from sleeping at night, states her stomach is "hard" and has been  like this for 3 weeks; states she is going to ER after PT to have stomach checked. Patient states she is walking but finds herself SOB, which she thinks is related to stomach issue.   EVAL: Pt diagnosed with spinal stenosis in 2021 and she states it has been worsening for the past few years. She states she also has a B12 deficiency which causes neuropathy in both feet. She states she was doing well and doing PT until April 2024, then she got sick and had a set back. She is returning with c/o bilat LE fatigue as well as back pain. Pt states she can stand 5 minutes before fatigue, she feels better with walking. She does get nervous about balance when walking in community. She uses SPC for gait in community but ambulates without it at home. Pt takes gabapentin at night due to neuropathy and hopes to be able to stop using it  PERTINENT HISTORY:  Neuropathy Spinal stenosis  PAIN:  Are you having pain? Pt reports fatigue in LEs but no pain - legs are tired   PRECAUTIONS: None   WEIGHT BEARING RESTRICTIONS: No  FALLS:  Has patient fallen in last 6 months? Yes. 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  PATIENT GOALS: improve strength and balance, not use gabapentin  NEXT MD VISIT: PRN  OBJECTIVE:   DIAGNOSTIC FINDINGS:  None current - lumbar MRI 2021 shows spinal stenosis L4-L5    SENSATION: Neuropathy bilat LE  LUMBAR ROM:   AROM eval  Flexion   Extension   Right lateral flexion   Left lateral flexion   Right rotation   Left rotation    (Blank rows = not tested)   LOWER EXTREMITY MMT:    MMT Right eval Left eval  Hip flexion 3+ 3+  Hip extension    Hip abduction    Hip adduction    Hip internal rotation    Hip external rotation    Knee flexion 4 4  Knee extension 4- 4-  Ankle dorsiflexion 3+ 3+  Ankle plantarflexion 3+ 3+  Ankle inversion    Ankle eversion     (Blank rows = not tested)   FUNCTIONAL TESTS:  5 times sit to stand: 27.28 seconds Timed up and go (TUG): 16.36 seconds BERG BALANCE TEST Sitting to Standing: 4.      Stands without using hands and stabilize independently Standing Unsupported: 3.      Stands 2 minutes with supervision Sitting Unsupported: 4.     Sits for 2 minutes independently Standing to Sitting: 2.     Uses back of legs against chair to control descent  Transfers: 3.     Transfers safely definite use of hands Standing with eyes closed: 1.     Unable to keep eyes closed for 3 seconds, but is safe Standing with feet together: 1.     Needs help to attain position but can hold for 15 seconds Reaching forward with outstretched arm: 3.     Reaches forward 5 inches Retrieving object from the floor: 4.      Able to pick up easily and safely Turning to look behind: 4.     Looks behind from both sides and weight shifts well Turning 360 degrees: 2.  Able to turn slowly, but safely Place alternate foot on stool: 1.     Completes >2 steps with minimal assist Standing with one foot in front: 1.     Needs help to step, but can hold for 15 seconds Standing on one foot: 0.     Unable  Total Score: 33/56   GAIT: Distance walked:  100' Assistive device utilized: Single point cane Level of assistance: Modified independence Comments: decreased cadence, decreased knee flexion during gait, wide BOS   TODAY'S TREATMENT:   [facility] Adult PT Treatment:                                                DATE: 08/01/2023 Therapeutic Exercise: Standing: Squats with seat tap on stool x10 --> added heel raises in standing x10 Hip abd x10 (B) --> added YTB at ankles x10 (B) Hip ext x10 (B) --> added YTB at ankles x10 (B) Foot tap outs front/side/back x5 sets (B) YTB at ankles Marching 3x30"     [facility] Adult PT Treatment:                                                DATE: 07/26/2023 Therapeutic Exercise: Seated  Sit to stand x 10 with rest during reps  Ankle DF green TB 3 sec x 10  Side Lying: Clamshells x 10 R/L Straight leg hip abd x 10 R/L Bent knee hip extension x 10 R/L Standing (holding treadmill rail)  Heel raise bilat x 10  Marching x 20 R/L  Mini knee bends x 10    PATIENT EDUCATION:  Education details: Updated HEP Person educated: Patient Education method: Explanation, Demonstration, and Handouts Education comprehension: verbalized understanding and returned demonstration  HOME EXERCISE PROGRAM: Access Code: [email or web address] URL: [email or web address] Date: 07/21/2023 Prepared by: Carlynn Herald  Exercises - Hip Abduction with Resistance Loop  - 1 x daily - 7 x weekly - 3 sets - 10 reps - Hip Extension with Resistance Loop  - 1 x daily - 7 x weekly - 3 sets - 10 reps - Standing Hamstring Curl with Resistance  - 1 x daily - 7 x weekly - 3 sets - 10 reps - Sitting Knee Extension with Resistance  - 1 x daily - 7 x weekly - 3 sets - 10 reps - Sit to Stand Without Arm Support  - 1 x daily - 7 x weekly - 3 sets - 5 reps - Standing Tandem Balance with Counter Support  - 1 x daily - 7 x weekly - 1 sets - 3 reps - 30 sec hold - Clamshell  - 1 x daily - 7 x weekly - 3 sets - 10 reps - Sidelying Hip Abduction   - 1 x daily - 7 x weekly - 3 sets - 10 reps - Standing Hip Extension with Leg Bent and Support  - 1 x daily - 7 x weekly - 3 sets - 10 reps - Walking with Head Rotation  - 1 x daily - 7 x weekly - 3 sets - 10 reps  ASSESSMENT:  CLINICAL IMPRESSION: Side lying and supine exercises deferred this visit due to patient not tolerating positions due to distended stomach and discomfort. Standing LE strengthening  exercises continued; patient required rest breaks due to increased fatigue in legs and mild SOB with physical activity.   GOALS: Goals reviewed with patient? Yes  SHORT TERM GOALS: Target date: 07/22/2023  Pt will be independent with initial HEP Baseline: Goal status: MET  2.  Pt will improve 5 x STS to <= to 24 seconds to demo improving strength Baseline: 19 sec Goal status: MET   LONG TERM GOALS: Target date: 08/19/2023  Pt will be independent with advanced HEP Baseline:  Goal status: INITIAL  2.  Pt will improve 5 x STS to <= 21 seconds to demo improved strength Baseline: 19 sec Goal status: INITIAL  3.  Pt will demo improved gait by performing TUG in <= 11 seconds Baseline: 16.36 sec Goal status: INITIAL  4.  Pt will demo improved balance by scoring >= 40 on Berg balance test Baseline: 33/56 Goal status: INITIAL  5.  Pt will report being able to walk and stand x 15 minutes before needing to rest Baseline:  Goal status: INITIAL   PLAN:  PT FREQUENCY: 2x/week  PT DURATION: 6 weeks  PLANNED INTERVENTIONS: Therapeutic exercises, Therapeutic activity, Neuromuscular re-education, Balance training, Gait training, Patient/Family education, Self Care, Joint mobilization, Aquatic Therapy, Dry Needling, Electrical stimulation, Cryotherapy, Moist heat, Taping, Vasopneumatic device, Ionotophoresis 4mg /ml Dexamethasone, Manual therapy, and Re-evaluation.  PLAN FOR NEXT SESSION: Continue general LE strength and conditioning, balance   Sanjuana Mae, PTA 08/01/2023, 2:30  PM PHYSICAL THERAPY DISCHARGE SUMMARY  Visits from Start of Care: 4  Current functional level related to goals / functional outcomes: See above   Remaining deficits: See above   Education / Equipment: HEP   Patient agrees to discharge. Patient goals were partially met. Patient is being discharged due to not returning since the last visit.  Reggy Eye, PT,DPT03/07/259:08 AM

## 2023-08-03 ENCOUNTER — Telehealth: Payer: Self-pay

## 2023-08-04 ENCOUNTER — Ambulatory Visit: Payer: Medicaid Other | Admitting: Physical Therapy

## 2023-08-08 ENCOUNTER — Telehealth: Payer: Self-pay

## 2023-08-08 NOTE — Transitions of Care (Post Inpatient/ED Visit) (Signed)
08/08/2023  Name: Julie Kennedy MRN: [id number] DOB: 01/06/1986  Today's TOC FU Call Status: Today's TOC FU Call Status:: Successful TOC FU Call Completed TOC FU Call Complete Date: 08/08/23 Patient's Name and Date of Birth confirmed.  Transition Care Management Follow-up Telephone Call Date of Discharge: 08/05/23 Discharge Facility: Other [facility]) Name of Other (Non[facility]) Discharge Facility: [facility] Type of Discharge: Inpatient Admission Primary Inpatient Discharge Diagnosis:: abd pain How have you been since you were released from the hospital?: Worse Any questions or concerns?: No  Items Reviewed: Did you receive and understand the discharge instructions provided?: Yes Medications obtained,verified, and reconciled?: Yes (Medications Reviewed) Any new allergies since your discharge?: No Dietary orders reviewed?: Yes Do you have support at home?: Yes People in Home: spouse  Medications Reviewed Today: Medications Reviewed Today     Reviewed by Karena Addison, LPN (Licensed Practical Nurse) on 08/08/23 at 1308  Med List Status: <None>   Medication Order Taking? Sig Documenting Provider Last Dose Status Informant  ACETAMINOPHEN PO [id number] Yes Take by mouth as needed. [provider] Taking Active Self  amLODipine (NORVASC) 5 MG tablet [id number] Yes Take 5 mg by mouth daily. [provider] Taking Active   B-D 3CC LUER-LOK SYR 23GX1" 23G X 1" 3 ML MISC [id number] Yes USE AS DIRECTED TO INJECT B12 INJECTIONS Anson Fret, MD Taking Active   ciprofloxacin (CIPRO) 500 MG tablet [id number] Yes Take 500 mg by mouth 2 (two) times daily. [provider] Taking Active   cyanocobalamin (VITAMIN B12) 1000 MCG/ML injection [id number] Yes Inject 1 mL (1,000 mcg total) into the muscle once a week. Weekly for 3 weeks. Then monthly thereafter (call for new prescription). Anson Fret, MD Taking Active   cyanocobalamin (VITAMIN B12) 1000  MCG/ML injection [id number] Yes Inject 1 mL (1,000 mcg total) into the muscle every 30 (thirty) days. Anson Fret, MD Taking Active   DULoxetine (CYMBALTA) 60 MG capsule [id number] Yes Take 1 capsule (60 mg total) by mouth daily. Anson Fret, MD Taking Active            Med Note Daivd Council, Pincus Sanes   Tue Sep 28, 2022  1:00 PM) 09/28/22 no PA required  gabapentin (NEURONTIN) 300 MG capsule [id number] Yes Take 2-3 capsules (600-900 mg total) by mouth at bedtime. Anson Fret, MD Taking Active   hydrOXYzine (ATARAX/VISTARIL) 10 MG tablet [id number] Yes Take by mouth. [provider] Taking Active   lactulose (CHRONULAC) 10 GM/15ML solution [id number] Yes Take 10 g by mouth 2 (two) times daily. [provider] Taking Active   Multiple Vitamin (MULTI-VITAMIN) tablet [id number] Yes Take by mouth. [provider] Taking Active   Multiple Vitamin (MULTI-VITAMIN) tablet [id number] Yes Take 1 tablet by mouth every morning. [provider] Taking Active   NONFORMULARY OR COMPOUNDED ITEM [id number] Yes Compound: amantadine 8%, baclofen 2%, gabapentin 8%, amitriptyline 4%, bupivacaine 2%, clonidine 0.2%, and nifedipine 2%.   Apply 1-2 grams to the affected area 3-4 times daily. Dispense 240 grams with 5 refills. Anson Fret, MD Taking Active   OVER THE COUNTER MEDICATION [id number] Yes in the morning and at bedtime. [employer] [facility] [provider] Taking Active Self            Home Care and Equipment/Supplies: Were Home Health Services Ordered?: NA Any new equipment or medical supplies ordered?: NA  Functional Questionnaire: Do you need assistance with bathing/showering or dressing?: No Do you need assistance with meal  preparation?: No Do you need assistance with eating?: No Do you have difficulty maintaining continence: No Do you need assistance with getting out of bed/getting out of a chair/moving?: No Do you have difficulty managing or  taking your medications?: No  Follow up appointments reviewed: PCP Follow-up appointment confirmed?: Yes Date of PCP follow-up appointment?: 08/12/23 Follow-up Provider: [facility] Follow-up appointment confirmed?: Yes Date of Specialist follow-up appointment?: 08/18/23 Follow-Up Specialty Provider:: [facility] Do you need transportation to your follow-up appointment?: No Do you understand care options if your condition(s) worsen?: Yes-patient verbalized understanding    SIGNATURE Karena Addison, LPN [facility] Nurse Health Advisor Direct Dial [phone]

## 2023-08-09 ENCOUNTER — Ambulatory Visit: Payer: Medicaid Other

## 2023-08-12 ENCOUNTER — Ambulatory Visit: Payer: Medicaid Other

## 2023-09-28 ENCOUNTER — Telehealth: Payer: Medicaid Other | Admitting: Neurology

## 2023-09-30 ENCOUNTER — Encounter: Payer: Self-pay | Admitting: Neurology

## 2023-10-03 ENCOUNTER — Encounter: Payer: Self-pay | Admitting: Neurology

## 2023-10-03 ENCOUNTER — Telehealth (INDEPENDENT_AMBULATORY_CARE_PROVIDER_SITE_OTHER): Payer: Medicaid Other | Admitting: Neurology

## 2023-10-03 DIAGNOSIS — G621 Alcoholic polyneuropathy: Secondary | ICD-10-CM | POA: Diagnosis not present

## 2023-10-03 DIAGNOSIS — E538 Deficiency of other specified B group vitamins: Secondary | ICD-10-CM | POA: Diagnosis not present

## 2023-10-03 DIAGNOSIS — M48061 Spinal stenosis, lumbar region without neurogenic claudication: Secondary | ICD-10-CM | POA: Diagnosis not present

## 2023-10-03 DIAGNOSIS — K7031 Alcoholic cirrhosis of liver with ascites: Secondary | ICD-10-CM | POA: Insufficient documentation

## 2023-10-03 DIAGNOSIS — G63 Polyneuropathy in diseases classified elsewhere: Secondary | ICD-10-CM

## 2023-10-03 NOTE — Progress Notes (Signed)
[facility]    Provider:  Dr Lucia Gaskins Requesting Provider: Sharlene Dory* Primary Care Provider:  Deneise Lever, FNP  CC:  Burning in the hands and feet  Virtual Visit via Video Note  I connected with Julie Kennedy on 10/03/23 at  8:30 AM EST by a video enabled telemedicine application and verified that I am speaking with the correct person using two identifiers.  Location: Patient: home Provider: office   I discussed the limitations of evaluation and management by telemedicine and the availability of in person appointments. The patient expressed understanding and agreed to proceed.  Follow Up Instructions:    I discussed the assessment and treatment plan with the patient. The patient was provided an opportunity to ask questions and all were answered. The patient agreed with the plan and demonstrated an understanding of the instructions.   The patient was advised to call back or seek an in-person evaluation if the symptoms worsen or if the condition fails to improve as anticipated.  I provided 20 minutes of non-face-to-face time during this encounter.   Anson Fret, MD   10/03/2023: She is currently at [facility] doing PT has been in the hospital since mid September, she had a lot of swelling in her abdomen. She was admitted to [facility] in September for Decompensated hepatic cirrhosis (*), Active Problems, GAD (generalized anxiety disorder), Hereditary hearing loss, Anasarca, Hyperbilirubinemia, Hypertension, Alcoholic cirrhosis of liver with ascites (*), Hyponatremia Ascites of liver, Class 3 severe obesity in adult (*). 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 She thought she may need a transplant but now she does not. She states that the topical lotion helped. She is on 300mg  gabapentin at bedtime, lyrica made her gain weight, she will ask the docs to increase gabapentin only at bedtime. Stop the cymbalta.   09/27/2022: Patient last  seen 2021. 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 We referred to Pain management referral. She declined LP but encouraged it to look for elevated protein. Symptoms are stable even improved. Taking B12 improved the symptoms. She is taking oral b12 and the injections were better. Will send b12 injections to home they helped a lot. She is not getting worse, so likely B12 is the right diagnosis.  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 She is following for her low back and suggested surgery. That may be the cause of some of her symptoms. Her bacj is very sore, she went to PT, conservative measures did not help. No other focal neurologic deficits, associated symptoms, inciting events or modifiable factors. We discussed options, see A/P below.   04/05/2020: IMPRESSION: additional 10 minutes outsie appointment reviewing 1. L4-5 disc protrusion resulting in moderate spinal stenosis and left greater than right lateral recess stenosis. 2. L5-S1 disc protrusion with right lateral recess stenosis.  Patient complains of symptoms per HPI as well as the following symptoms: low back pain . Pertinent negatives and positives per HPI. All others negative  HPI 2021:  Julie Kennedy is a 37 y.o. female here as requested by Sharlene Dory* for neuropathic pain.  She has a past medical history of anxiety, depression, frequent headaches, transient neurologic symptoms, hard of hearing and hypertension.  Patient  was already seen by neurology in April 2021 for dysesthesias at multiple sites, she reported her feet started going numb with tingling 2 months prior, over the last month "is ridiculous", her mouth reported  swollen, her head and hands her legs and neck hurting, she reported needing help getting her tennis shoes on, like pins-and-needles and burning sensation 10 out of 10 and she was in a wheelchair, she uses crutches at home, she felt 6 months prior and has a 11-year-old, she got up to run and slipped on the living room rug.  MRI of the cervical and thoracic spine March 01, 2020 showed minimal disc bulging without significant stenosis with normal thoracic spine.  MRI of the brain 11/10/2018 normal.  Neurologic exam was reassuring, with reserved reflexes, 1+ symmetric in upper and lower extremity, EMG nerve conduction study was suggested but she declined,exam appeared normal.  Meds she tried include Pamelor that did not work, gabapentin, Lyrica, prednisone, labs including sed rate, TSH, Sjogren's and EMG nerve conduction study was offered.  Patient is here alone with tingling and numbness burning in the hands and feet, hands are more numb than the feet, she reports she has "spinal damage", she fell back in January, she jumped off the couch she should often alarm and she fell on her bottom, she took Tylenol for a couple days and then in April she could not walk at all, she fell on her buttocks, progressively worsened and by April she had burning in her feet and hands, she was in a wheelchair, she couldn't walk at all due to weakness, she couldn't get in the shower, she went to a neurologist, she went to a spine specialist, she was hospitalist for this and then she saw neurology at [facility] and was unhappy with the doctor, her walking has improved since she saw a chiropractor but still has symptoms. She takes Lyrica, amitriptyline, she is improved and is taking B12. It may keep her up all night. She has burning on the bottom of her feet and her toes are throbbing, hands tingle, but symptoms were from head to toe prior to improvement. No other precipitating factors. No other focal neurologic deficits, associated symptoms,  inciting events or modifiable factors.  Reviewed notes, labs and imaging from outside physicians, which showed:   Patient brings MRI on a CD 04/06/2020, I reviewed the images and agree with the report that shows L4-L5 disc protrusion resulting in moderate spinal stenosis and left greater than right lateral recess stenosis.  L5-S1 disc protrusion with right lateral recess stenosis.  Personally reviewed MRI brain images 04/06/2020 which was normal.  04/2019 b-12 315    I reviewed notes from [facility] back in March of this year, patient had Covid symptoms a few weeks prior to appointment on 01/21/2020, she developed bilateral foot pain ankle to toes on both feet, burning pain, hurts to walk and wear shoes, reports pain started a week prior to appointment, having trouble sleeping due to the pain, both feet are very sensitive, feet cold, she denied low back pain redness or swelling in her feet, her Covid symptoms resolved and she denied bowel bladder incontinence.  She was found to have low magnesium, potassium and B12. Per Dr. Hollie Beach notes in June of this year, patient has been continuing dealing with pain issues, she had an MRI that prompted referral to neurosurgical team (moderate spinal stenosis of the lumbar spine), she apparently had an appointment with the spine specialist in June of this year, lots  of anxiety, taking Lyrica for pain.  Also Elavil was added.  Per Dr. Hollie Beach notes in April of this year, patient reported burning in both her feet, Effexor did not help, negative autoimmune work-up, no weakness or balance issues otherwise.  She also reported numbness of feet, hands and lips.  Also tried nortriptyline.  And a prednisone burst.  Cymbalta was considered.  MRI of the lumbar spine showed moderate spinal stenosis and L4-L5 lateral recess stenosis, L5-S1 disc protrusion with right lateral recess stenosis.  MRI of the brain was negative.  I personally reviewed images and agree with the  above.  These images were completed Apr 06, 2020.     Review of Systems: Patient complains of symptoms per HPI as well as the following symptoms:numbness, weakness. Pertinent negatives and positives per HPI. All others negative.   Social History   Socioeconomic History   Marital status: Significant Other    Spouse name: Not on file   Number of children: 1   Years of education: Not on file   Highest education level: Not on file  Occupational History   Not on file  Tobacco Use   Smoking status: Never   Smokeless tobacco: Never  Vaping Use   Vaping status: Never Used  Substance and Sexual Activity   Alcohol use: Not Currently    Comment: was casually    Drug use: Never    Comment: pt denies a history, but cocaine listed from previous visit    Sexual activity: Not Currently  Other Topics Concern   Not on file  Social History Narrative   Lives at home with boyfriend and daughter    Right handed   Caffeine: seldom    Social Determinants of Health   Financial Resource Strain: Low Risk  (09/15/2023)   Received from [facility]   Overall Financial Resource Strain (CARDIA)    Difficulty of Paying Living Expenses: Not very hard  Food Insecurity: No Food Insecurity (09/15/2023)   Received from [facility]   Hunger Vital Sign    Worried About Running Out of Food in the Last Year: Never true    Ran Out of Food in the Last Year: Never true  Transportation Needs: No Transportation Needs (09/15/2023)   Received from [facility] - Transportation    In the past 12 months, has lack of transportation kept you from medical appointments or from getting medications?: No    Lack of Transportation (Non-Medical): No  Physical Activity: Unknown (04/01/2023)   Received from [facility]   Exercise Vital Sign    Days of Exercise per Week: Patient declined    Minutes of Exercise per Session: 10 min  Stress:  Patient Unable To Answer (08/26/2023)   Received from [facility] - Occupational Stress Questionnaire    Feeling of Stress : Patient unable to answer  Recent Concern: Stress - Stress Concern Present (08/03/2023)   Received from [facility] of [facility]ational Health - Occupational Stress Questionnaire    Feeling of Stress : To some extent  Social Connections: Somewhat Isolated (04/01/2023)   Received from [facility]   Social Network    How would you rate your social network (family, work, friends)?: Restricted participation with some degree of social isolation  Intimate Partner Violence: Not At Risk (08/16/2023)   Received from [facility]   HITS  Over the last 12 months how often did your partner physically hurt you?: Never    Over the last 12 months how often did your partner insult you or talk down to you?: Never    Over the last 12 months how often did your partner threaten you with physical harm?: Never    Over the last 12 months how often did your partner scream or curse at you?: Never    Family History  Problem Relation Age of Onset   Arthritis Mother    Depression Mother    Hypertension Mother    Neuropathy Neg Hx     Past Medical History:  Diagnosis Date   Anxiety    Depression    Frequent headaches    Hard of hearing    Hypertension     Patient Active Problem List   Diagnosis Date Noted   Spinal stenosis of lumbar region with neurogenic claudication 09/27/2022   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 09/27/2022   Vitamin B12 deficiency neuropathy (HCC) 09/27/2022   Polyneuropathy associated with underlying disease (HCC) 08/11/2020   B12 deficiency 08/11/2020   Paresthesias 07/08/2020   Neuropathic pain 07/08/2020   Thrombocytopenia (HCC) 01/28/2020   Normocytic anemia 01/28/2020   TIA (transient ischemic attack) 10/28/2018   Essential hypertension  04/27/2018   GAD (generalized anxiety disorder) 04/27/2018    Past Surgical History:  Procedure Laterality Date   CESAREAN SECTION     CHOLECYSTECTOMY N/A 07/07/2018   Procedure: LAPAROSCOPIC CHOLECYSTECTOMY WITH INTRAOPERATIVE CHOLANGIOGRAM ERAS PATHWAY;  Surgeon: Griselda Miner, MD;  Location: [facility];  Service: General;  Laterality: N/A;   TONSILLECTOMY AND ADENOIDECTOMY      Current Outpatient Medications  Medication Sig Dispense Refill   ACETAMINOPHEN PO Take by mouth as needed.     amLODipine (NORVASC) 5 MG tablet Take 5 mg by mouth daily.     B-D 3CC LUER-LOK SYR 23GX1" 23G X 1" 3 ML MISC USE AS DIRECTED TO INJECT B12 INJECTIONS 1 each 0   ciprofloxacin (CIPRO) 500 MG tablet Take 500 mg by mouth 2 (two) times daily.     cyanocobalamin (VITAMIN B12) 1000 MCG/ML injection Inject 1 mL (1,000 mcg total) into the muscle once a week. Weekly for 3 weeks. Then monthly thereafter (call for new prescription). 3 mL 0   cyanocobalamin (VITAMIN B12) 1000 MCG/ML injection Inject 1 mL (1,000 mcg total) into the muscle every 30 (thirty) days. 1 mL 5   gabapentin (NEURONTIN) 300 MG capsule Take 2-3 capsules (600-900 mg total) by mouth at bedtime. 90 capsule 4   hydrOXYzine (ATARAX/VISTARIL) 10 MG tablet Take by mouth.     lactulose (CHRONULAC) 10 GM/15ML solution Take 10 g by mouth 2 (two) times daily.     Multiple Vitamin (MULTI-VITAMIN) tablet Take by mouth.     Multiple Vitamin (MULTI-VITAMIN) tablet Take 1 tablet by mouth every morning.     NONFORMULARY OR COMPOUNDED ITEM Compound: amantadine 8%, baclofen 2%, gabapentin 8%, amitriptyline 4%, bupivacaine 2%, clonidine 0.2%, and nifedipine 2%.   Apply 1-2 grams to the affected area 3-4 times daily. Dispense 240 grams with 5 refills.     OVER THE COUNTER MEDICATION in the morning and at bedtime. Ultimate Women Vitamin     No current facility-administered medications for this visit.    Allergies as of 10/03/2023 - Review Complete 08/08/2023   Allergen Reaction Noted   Percocet [oxycodone-acetaminophen] Other (See Comments) 07/04/2018    Vitals: There were no vitals taken for  this visit. Last Weight:  Wt Readings from Last 1 Encounters:  10/10/20 251 lb (113.9 kg)   Last Height:   Ht Readings from Last 1 Encounters:  10/10/20 5\' 8"  (1.727 m)    Physical exam: Exam: Gen: NAD, conversant      CV: No palpitations or chest pain or SOB. VS: Breathing at a normal rate. obes. Not febrile. Eyes: Conjunctivae clear without exudates or hemorrhage  Neuro: Detailed Neurologic Exam  Speech:    Speech is normal; fluent and spontaneous with normal comprehension.  Cognition:    The patient is oriented to person, place, and time;     recent and remote memory intact;     language fluent;     normal attention, concentration, fund of knowledge Cranial Nerves:    The pupils are equal, round, and reactive to light. Visual fields are full Extraocular movements are intact.  The face is symmetric with normal sensation. The palate elevates in the midline. Hearing intact. Voice is normal. Shoulder shrug is normal. The tongue has normal motion without fasciculations.   Coordination: normal  Gait:    No abnormalities noted or reported  Motor Observation:   no involuntary movements noted. Tone:    Appears normal  Posture:    Posture is normal. normal erect    Strength:    Strength is anti-gravity and symmetric in the upper and lower limbs. But she has weakness, in a wheelchair, possibly critical illness myopathy per notes     Sensation: intact to LT, no reports of numbness or tingling or paresthesias          Assessment/Plan:  Patient with acute onset paresthesias in the feet after falling on her buttocks with moderately severe lumbar spinal stenosis,, severe burning, paresthesias, on multiple nerve pain medications. Then developed symptoms in the hands. MRI of the brain and cervical spine unremarkable. MRI Lumbar spine with  moderately severe stenosis. Exam shows trace reflexes (but she is guarding so difficult to assess) and Dysesthesia and allodynia of both hands and both feet.   - she is on gabapentin 300mg  at bedtime, she is in [facility] for liver cirrhosis, alcoholic, she stopped the cymbalta due to one batch having a cancerous ingredient so she was not comdfportable anymore. Aski [facility] to see if they can titrate gabapentin(side effects to lyrica) and check b12 she was on injections in the past now oral need to follow  - in light of alcoholic cirrhosis, likely etoh another contrinutor to neuropathy  - will see her as needed, she will call the office  - Patient last seen 2021. B12 194, oral not working. 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 She is taking oral b12 without improvement and the injections were better. Will send b12 injections to home they helped a lot. She is not getting worse, so likely B12 is the right diagnosis.  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   Moderate spinal stenosis lumbar spine: hat may be the cause of some of her symptoms. Her bacj is very sore, she went to PT, conservative measures did not help, she had epidural steroid injections. Repeat MRI lumbar spine, has neurogenic claudication, need to follow   Pain: Compounding crem, increase cymbalta, get start gabapentin at bedtime.    04/05/2020: IMPRESSION: 1. L4-5 disc protrusion resulting in moderate spinal stenosis and left greater than right lateral recess stenosis. 2.  L5-S1 disc protrusion with right lateral recess stenosis.    Cc: Modena Morrow, FNP  Naomie Dean, MD  [facility] [address] [place], [place] [address]  Phone [phone] Fax [phone]

## 2024-01-20 ENCOUNTER — Encounter: Payer: Self-pay | Admitting: Rehabilitative and Restorative Service Providers"

## 2024-01-23 ENCOUNTER — Ambulatory Visit: Payer: Medicaid Other
